# Patient Record
Sex: Male | Born: 1945 | Race: White | Hispanic: No | Smoking: Current every day smoker
Health system: Southern US, Community
[De-identification: ages and names within clinical notes are randomized; demographics above are authoritative.]

## PROBLEM LIST (undated history)

## (undated) DIAGNOSIS — I10 Essential (primary) hypertension: Secondary | ICD-10-CM

## (undated) DIAGNOSIS — F419 Anxiety disorder, unspecified: Secondary | ICD-10-CM

## (undated) DIAGNOSIS — M1990 Unspecified osteoarthritis, unspecified site: Secondary | ICD-10-CM

## (undated) DIAGNOSIS — J449 Chronic obstructive pulmonary disease, unspecified: Secondary | ICD-10-CM

## (undated) HISTORY — PX: BLADDER SURGERY: SHX569

## (undated) HISTORY — DX: Chronic obstructive pulmonary disease, unspecified: J44.9

---

## 1993-04-19 HISTORY — PX: HERNIA REPAIR: SHX51

## 2003-07-13 ENCOUNTER — Emergency Department (HOSPITAL_COMMUNITY): Admission: EM | Admit: 2003-07-13 | Discharge: 2003-07-13 | Payer: Self-pay | Admitting: Emergency Medicine

## 2009-03-04 ENCOUNTER — Ambulatory Visit (HOSPITAL_COMMUNITY): Admission: RE | Admit: 2009-03-04 | Discharge: 2009-03-04 | Payer: Self-pay | Admitting: Urology

## 2009-04-14 ENCOUNTER — Ambulatory Visit (HOSPITAL_COMMUNITY): Admission: RE | Admit: 2009-04-14 | Discharge: 2009-04-14 | Payer: Self-pay | Admitting: Urology

## 2010-05-12 LAB — BASIC METABOLIC PANEL
BUN: 9 mg/dL (ref 6–23)
CO2: 29 mEq/L (ref 19–32)
Calcium: 8.7 mg/dL (ref 8.4–10.5)
Chloride: 95 mEq/L — ABNORMAL LOW (ref 96–112)
Creatinine, Ser: 0.98 mg/dL (ref 0.4–1.5)
GFR calc Af Amer: >60 mL/min (ref >60)
GFR calc non Af Amer: >60 mL/min (ref >60)
Glucose, Bld: 75 mg/dL (ref 70–99)
Potassium: 3.7 mEq/L (ref 3.5–5.1)
Sodium: 131 mEq/L — ABNORMAL LOW (ref 135–145)

## 2010-05-12 LAB — SURGICAL PCR SCREEN
MRSA, PCR: NEGATIVE
Staphylococcus aureus: NEGATIVE

## 2010-05-12 LAB — HEMOGLOBIN AND HEMATOCRIT, BLOOD
HCT: 45.4 % (ref 39.0–52.0)
Hemoglobin: 16.1 g/dL (ref 13.0–17.0)

## 2010-05-14 ENCOUNTER — Ambulatory Visit (HOSPITAL_COMMUNITY)
Admission: RE | Admit: 2010-05-14 | Discharge: 2010-05-14 | Payer: Self-pay | Source: Home / Self Care | Attending: Urology | Admitting: Urology

## 2010-05-14 NOTE — H&P (Addendum)
  NAME:  Troy Sanders, Troy Sanders                ACCOUNT NO.:  [id number]  MEDICAL RECORD NO.:  [id number]          PATIENT TYPE:  AMB  LOCATION:  DAY                           FACILITY:  [facility]  PHYSICIAN:  Ky Barban, M.D.DATE OF BIRTH:  12-14-1945  DATE OF ADMISSION:  05/14/2010 DATE OF DISCHARGE:  LH                             HISTORY & PHYSICAL   CHIEF COMPLAINT:  Carcinoma in situ bladder.  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  Urine cytologies are negative.  He is being brought as outpatient to undergo multiple bladder biopsies and cystoscopy under anesthesia as outpatient. He understands.  PAST MEDICAL HISTORY:  Except carcinoma in situ of the bladder, otherwise he is fine.  He had penile condyloma removed in 2010, by Dr. Rito Ehrlich and no history of any other significant medical problem.  PAST HISTORY:  Negative.  FAMILY HISTORY:  Negative.  ALLERGIES:  None.  MEDICATIONS:  None.  REVIEW OF SYSTEMS:  Unremarkable.  PHYSICAL EXAMINATION:  VITAL SIGNS:  Blood pressure is 140/80, temperature is normal. CENTRAL NERVOUS SYSTEM:  Negative. HEAD, NECK, EYES, AND ENT:  Negative. CHEST:  Symmetrical. HEART:  Regular sinus rhythm.  No murmur. ABDOMEN:  Soft, flat.  Liver, spleen and kidneys are not palpable.  No CVA tenderness. EXTERNAL GENITALIA:  Unremarkable. RECTAL:  Deferred. EXTREMITIES:  Normal.  IMPRESSION:  History of carcinoma in situ of bladder, status post BCG treatment.  PLAN:  Cystoscopy, multiple bladder biopsies under anesthesia as outpatient.     Ky Barban, M.D.     MIJ/MEDQ  D:  05/13/2010  T:  05/14/2010  Job:  [id number]  Electronically Signed by Alleen Borne M.D. on 05/14/2010 04:55:52 PM

## 2010-05-27 NOTE — Op Note (Signed)
  NAME:  Troy Sanders, Troy Sanders                ACCOUNT NO.:  [id number]  MEDICAL RECORD NO.:  [id number]          PATIENT TYPE:  AMB  LOCATION:  DAY                           FACILITY:  [facility]  PHYSICIAN:  Ky Barban, M.D.DATE OF BIRTH:  Apr 26, 1945  DATE OF PROCEDURE:  05/14/2010 DATE OF DISCHARGE:  05/14/2010                              OPERATIVE REPORT   PREOPERATIVE DIAGNOSIS:  Bladder cancer.  POSTOPERATIVE DIAGNOSIS:  Bladder cancer.  PROCEDURES: 1. Cystoscopy. 2. Multiple bladder biopsies.  ANESTHESIA:  General spinal.  DESCRIPTION OF PROCEDURE:  The patient, under spinal anesthesia, in lithotomy position, after usual prep and drape, #25 cystoscope introduced into the bladder, it was thoroughly inspected.  There was no gross evidence of any tumor, stone, foreign body, inflammation.  Then, using a flexible biopsy forceps, multiple biopsies from different areas in the bladder were done, first posterior bladder wall, then right bladder wall, and left bladder wall at the end, anterior bladder wall. All the biopsy was done.  Then using a Greenwald electrode, the area of the biopsy site were simply fulgurated to stop the bleeding at the end. There is no bleeding.  Urine is clear and I already had looked at the ureteral orifices.  They looked normal with clear reflux.  Cystoscope was removed and I did a rectal exam.  Prostate feels normal.  It is firm, 1-1/2+.  All the instruments were removed.  The patient left the operating room in satisfactory condition.     Ky Barban, M.D.     MIJ/MEDQ  D:  05/14/2010  T:  05/15/2010  Job:  [id number]  Electronically Signed by Alleen Borne M.D. on 05/27/2010 11:00:01 AM

## 2010-07-20 LAB — BASIC METABOLIC PANEL
BUN: 11 mg/dL (ref 6–23)
CO2: 28 mEq/L (ref 19–32)
Calcium: 9.1 mg/dL (ref 8.4–10.5)
Chloride: 99 mEq/L (ref 96–112)
Creatinine, Ser: 0.99 mg/dL (ref 0.4–1.5)
GFR calc Af Amer: >60 mL/min (ref >60)
GFR calc non Af Amer: >60 mL/min (ref >60)
Glucose, Bld: 102 mg/dL — ABNORMAL HIGH (ref 70–99)
Potassium: 4 mEq/L (ref 3.5–5.1)
Sodium: 134 mEq/L — ABNORMAL LOW (ref 135–145)

## 2010-07-20 LAB — CBC
HCT: 47.5 % (ref 39.0–52.0)
Hemoglobin: 15.8 g/dL (ref 13.0–17.0)
MCHC: 33.3 g/dL (ref 30.0–36.0)
MCV: 92.5 fL (ref 78.0–100.0)
Platelets: 193 10*3/uL (ref 150–400)
RBC: 5.14 MIL/uL (ref 4.22–5.81)
RDW: 13.5 % (ref 11.5–15.5)
WBC: 5.6 10*3/uL (ref 4.0–10.5)

## 2010-07-20 LAB — DIFFERENTIAL
Basophils Absolute: 0 10*3/uL (ref 0.0–0.1)
Basophils Relative: 1 % (ref 0–1)
Eosinophils Absolute: 0.1 10*3/uL (ref 0.0–0.7)
Eosinophils Relative: 1 % (ref 0–5)
Lymphocytes Relative: 29 % (ref 12–46)
Lymphs Abs: 1.6 10*3/uL (ref 0.7–4.0)
Monocytes Absolute: 0.4 10*3/uL (ref 0.1–1.0)
Monocytes Relative: 7 % (ref 3–12)
Neutro Abs: 3.5 10*3/uL (ref 1.7–7.7)
Neutrophils Relative %: 62 % (ref 43–77)

## 2010-07-22 LAB — CREATININE, SERUM
Creatinine, Ser: 1.14 mg/dL (ref 0.4–1.5)
GFR calc Af Amer: >60 mL/min (ref >60)
GFR calc non Af Amer: >60 mL/min (ref >60)

## 2010-07-22 LAB — BUN: BUN: 9 mg/dL (ref 6–23)

## 2011-10-01 ENCOUNTER — Encounter: Payer: Self-pay | Admitting: Family Medicine

## 2011-10-01 ENCOUNTER — Ambulatory Visit (INDEPENDENT_AMBULATORY_CARE_PROVIDER_SITE_OTHER): Payer: Medicare Other | Admitting: Family Medicine

## 2011-10-01 VITALS — BP 138/72 | HR 55 | Resp 16 | Ht 74.0 in | Wt 191.0 lb

## 2011-10-01 DIAGNOSIS — R0602 Shortness of breath: Secondary | ICD-10-CM | POA: Diagnosis not present

## 2011-10-01 DIAGNOSIS — Z720 Tobacco use: Secondary | ICD-10-CM

## 2011-10-01 DIAGNOSIS — F17200 Nicotine dependence, unspecified, uncomplicated: Secondary | ICD-10-CM

## 2011-10-01 NOTE — Progress Notes (Signed)
  Subjective:    Patient ID: Troy Sanders, male    DOB: 1946-04-19, 66 y.o.   MRN: [id number]  HPI  Patient here to establish care. No medications. He has not had a primary doctor in 20 years. 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 He admits to shortness of breath worse at nighttime. He smokes 2 packs per day. Occasionally he feels himself wheezing. This is been going on for quite some time. He's never had any workup for this. He's not had any blood work in the past few years. He was seen by urology in the past after having incontinence. Cystoscopy was done which was concerning for bladder cancer however biopsies were negative.  Review of Systems  GEN- denies fatigue, fever, weight loss,weakness, recent illness HEENT- denies eye drainage, change in vision, nasal discharge, CVS- denies chest pain, palpitations RESP- + SOB, cough, wheeze ABD- denies N/V, change in stools, abd pain GU- denies dysuria, hematuria, dribbling, incontinence MSK- denies joint pain, muscle aches, injury Neuro- denies headache, dizziness, syncope, seizure activity      Objective:   Physical Exam GEN- NAD, alert and oriented x3 HEENT- PERRL, EOMI, non injected sclera, pink conjunctiva, MMM, oropharynx clear Neck- Supple, no thryomegaly CVS- RRR, no murmur RESP-occasional wheeze with forced expiration otherwise clear ABD-NABS,soft, NT,ND EXT- No edema Pulses- Radial, DP- 2+        Assessment & Plan:

## 2011-10-01 NOTE — Patient Instructions (Addendum)
Get the labs done within the next 2 weeks Get the breathing test done at [facility] F/U 4 weeks

## 2011-10-02 DIAGNOSIS — Z720 Tobacco use: Secondary | ICD-10-CM | POA: Insufficient documentation

## 2011-10-02 NOTE — Assessment & Plan Note (Signed)
Concern for COPD or chronic bronchitis, with cough and SOB worse at night. Will set up for PFT

## 2011-10-02 NOTE — Assessment & Plan Note (Signed)
Counseled on cessation 

## 2011-11-05 DIAGNOSIS — R0602 Shortness of breath: Secondary | ICD-10-CM | POA: Diagnosis not present

## 2011-11-06 LAB — COMPREHENSIVE METABOLIC PANEL
ALT: 11 U/L (ref 0–53)
AST: 11 U/L (ref 0–37)
Albumin: 4.3 g/dL (ref 3.5–5.2)
Alkaline Phosphatase: 76 U/L (ref 39–117)
BUN: 9 mg/dL (ref 6–23)
CO2: 33 mEq/L — ABNORMAL HIGH (ref 19–32)
Calcium: 9.1 mg/dL (ref 8.4–10.5)
Chloride: 103 mEq/L (ref 96–112)
Creat: 0.93 mg/dL (ref 0.50–1.35)
Glucose, Bld: 81 mg/dL (ref 70–99)
Potassium: 3.9 mEq/L (ref 3.5–5.3)
Sodium: 139 mEq/L (ref 135–145)
Total Bilirubin: 0.7 mg/dL (ref 0.3–1.2)
Total Protein: 6.5 g/dL (ref 6.0–8.3)

## 2011-11-06 LAB — CBC
HCT: 44.6 % (ref 39.0–52.0)
Hemoglobin: 15.4 g/dL (ref 13.0–17.0)
MCH: 30.1 pg (ref 26.0–34.0)
MCHC: 34.5 g/dL (ref 30.0–36.0)
MCV: 87.1 fL (ref 78.0–100.0)
Platelets: 185 10*3/uL (ref 150–400)
RBC: 5.12 MIL/uL (ref 4.22–5.81)
RDW: 13.8 % (ref 11.5–15.5)
WBC: 7.5 10*3/uL (ref 4.0–10.5)

## 2011-12-14 ENCOUNTER — Telehealth: Payer: Self-pay | Admitting: Family Medicine

## 2011-12-14 ENCOUNTER — Other Ambulatory Visit: Payer: Self-pay

## 2011-12-14 DIAGNOSIS — R0602 Shortness of breath: Secondary | ICD-10-CM

## 2011-12-23 ENCOUNTER — Ambulatory Visit (HOSPITAL_COMMUNITY): Payer: Medicare Other

## 2012-01-10 ENCOUNTER — Ambulatory Visit (HOSPITAL_COMMUNITY): Payer: Medicare Other | Attending: Family Medicine

## 2012-01-21 ENCOUNTER — Ambulatory Visit (HOSPITAL_COMMUNITY)
Admission: RE | Admit: 2012-01-21 | Discharge: 2012-01-21 | Disposition: A | Discharge status: Home / Self Care | Payer: Medicare Other | Source: Ambulatory Visit | Attending: Family Medicine | Admitting: Family Medicine

## 2012-01-21 DIAGNOSIS — R0602 Shortness of breath: Secondary | ICD-10-CM | POA: Diagnosis not present

## 2012-01-21 DIAGNOSIS — J449 Chronic obstructive pulmonary disease, unspecified: Secondary | ICD-10-CM | POA: Diagnosis not present

## 2012-01-21 DIAGNOSIS — J4489 Other specified chronic obstructive pulmonary disease: Secondary | ICD-10-CM | POA: Insufficient documentation

## 2012-01-21 MED ORDER — ALBUTEROL SULFATE (5 MG/ML) 0.5% IN NEBU
2.5000 mg | INHALATION_SOLUTION | Freq: Once | RESPIRATORY_TRACT | Status: AC
  Administered 2012-01-21: 2.5 mg via RESPIRATORY_TRACT

## 2012-01-24 NOTE — Procedures (Signed)
NAME:  Troy Sanders, Troy Sanders                ACCOUNT NO.:  [id number]  MEDICAL RECORD NO.:  [id number]  LOCATION:  RESP                          FACILITY:  [facility]  PHYSICIAN:  Sheza Strickland L. Juanetta Gosling, M.D.DATE OF BIRTH:  [birth year]  DATE OF PROCEDURE: DATE OF DISCHARGE:  01/21/2012                           PULMONARY FUNCTION TEST   Reason for pulmonary function testing is shortness of breath. 1. Spirometry shows a moderate to severe ventilatory defect with     evidence of airflow obstruction. 2. Lung volumes show marked air trapping. 3. DLCO is normal. 4. Airway resistance is elevated suggesting and confirming airflow     obstruction. 5. There is significant bronchodilator improvement. 6. This study is consistent with COPD.     Lilyanna Lunt L. Juanetta Gosling, M.D.     ELH/MEDQ  D:  01/24/2012  T:  01/24/2012  Job:  [id number]  cc:   Milinda Antis, MD

## 2012-02-01 LAB — PULMONARY FUNCTION TEST

## 2012-12-05 ENCOUNTER — Ambulatory Visit: Payer: Medicare Other | Admitting: Family Medicine

## 2012-12-22 ENCOUNTER — Encounter: Payer: Self-pay | Admitting: Family Medicine

## 2012-12-22 ENCOUNTER — Ambulatory Visit (INDEPENDENT_AMBULATORY_CARE_PROVIDER_SITE_OTHER): Payer: Medicare Other | Admitting: Family Medicine

## 2012-12-22 VITALS — BP 104/60 | HR 52 | Temp 97.2°F | Resp 18 | Wt 198.0 lb

## 2012-12-22 DIAGNOSIS — J449 Chronic obstructive pulmonary disease, unspecified: Secondary | ICD-10-CM | POA: Diagnosis not present

## 2012-12-22 DIAGNOSIS — J4489 Other specified chronic obstructive pulmonary disease: Secondary | ICD-10-CM

## 2012-12-22 DIAGNOSIS — F17200 Nicotine dependence, unspecified, uncomplicated: Secondary | ICD-10-CM

## 2012-12-22 DIAGNOSIS — Z720 Tobacco use: Secondary | ICD-10-CM

## 2012-12-22 MED ORDER — ALBUTEROL SULFATE HFA 108 (90 BASE) MCG/ACT IN AERS: 2.0000 | INHALATION_SPRAY | RESPIRATORY_TRACT | Status: DC | PRN

## 2012-12-22 NOTE — Patient Instructions (Signed)
F/U 4 weeks Albuterol is the rescue inhaler Chronic Obstructive Pulmonary Disease Chronic obstructive pulmonary disease (COPD) is a lung disease. The lungs become damaged. This makes it hard to get air in and out of your lungs. The damage to your lungs cannot be changed. There are things you can do to improve the lungs and make you feel better. HOME CARE  Take all medicines as told by your doctor.  Use medicines that you breathe in (inhale) as told by your doctor.  Avoid medicines or cough syrups that dry up your airway (antihistamines) and do not allow you to get rid of thick spit.  If you smoke, stop.  Avoid being around smoke, chemicals, and fumes that bother your breathing.  Avoid people that have a catchy (contagious) sickness.  Avoid going outside when it is very hot, cold, or humid.  Use humidifiers in your home and near your bedside if it helps your breathing.  Drink enough fluids to keep your pee (urine) clear or pale yellow.  Eat healthy foods. Eat smaller meals more often and rest before meals.  Ask your doctor if it is okay to take vitamins or pills with minerals (supplements).  Exercise and stay active.  Rest with activity.  Get into a comfortable position when you have trouble breathing.  Learn and use tips on how to relax.  Learn and use tips on how to control your breathing as told by your doctor. Try:  Breathing in through your nose for 1 second. Then, breath out (exhale) through your puckered (like a whistle) lips for 2 seconds.  Putting one hand on your belly (abdomen). Breathe in slowly through your nose. Your hand on your belly should move out. Breathe out slowly through your puckered lips. Your hand on your belly should move in as you breathe out.  Learn and use controlled coughing to clear thick spit from your lungs. 1. Lean your head slightly forward. 2. Breathe in deeply. 3. Try to hold your breath for 3 seconds. 4. Keep your mouth slightly open  while coughing 2 times. 5. Spit any thick spit out into a tissue. 6. Rest and do the steps again 1 or 2 times as needed.  Get all shots (vaccines) a told by your doctor.  Learn how to manage stress.  Schedule and go to all follow-up doctor visits.  Go to therapy that can help you improve your lungs (pulmonary rehabilitation) as told by your doctor.  Use oxygen at home as told by your doctor. GET HELP RIGHT AWAY IF:   You can feel your heart beating really fast.  You have shortness of breath while resting.  You have shortness of breath that stops you from being able to talk.  You have shortness of breath that stops you from doing normal activities.  You have chest pain lasting longer than 5 minutes.  You start to shake uncontrollably (seizure).  Your family or friends notice that you are flustered or confused.  You cough up more thick spit than usual.  There is a change in the color or thickness of the spit.  Breathing is more difficult than usual.  Your breathing is faster than usual.  Your skin color is more blueish than usual.  You are running out of the medicine you take for breathing.  You are anxious, uneasy, fearful, or restless.  You have a fever. MAKE SURE YOU:   Understand these instructions.  Will watch your condition.  Will get help right away if  you are not doing well or get worse. Document Released: 09/22/2007 Document Revised: 03/22/2012 Document Reviewed: 06/05/2010 [facility] Patient Information 2014 [place], [place].

## 2012-12-23 ENCOUNTER — Encounter: Payer: Self-pay | Admitting: Family Medicine

## 2012-12-23 DIAGNOSIS — J449 Chronic obstructive pulmonary disease, unspecified: Secondary | ICD-10-CM | POA: Insufficient documentation

## 2012-12-23 NOTE — Assessment & Plan Note (Signed)
COPD, PFT showed improvement with bronchodilators Will give him a stry on Symbicort. We had samples in office Albuterol also given for rescue inhaler Discussed course of disease, shown how to use inhaler in office

## 2012-12-23 NOTE — Assessment & Plan Note (Signed)
Discussed importance of tobacco cessation. Unfortunately he is not ready to quit He declines flu and PNA vaccine today

## 2012-12-23 NOTE — Progress Notes (Signed)
  Subjective:    Patient ID: Troy Sanders, male    DOB: [birth year], 67 y.o.   MRN: [id number]  HPI  Pt here with continued SOB, cough with little exertion. He established care 1 year ago, had PFT done which showed Moderately Severe COPD but was lost to follow-up. States he would like to try something to help his breathing. Continues to smoke 1 ppd. Has cough with mild clear sputum and SOB with his activities daily. Denies Chest pain.  Declines immuizations  Review of Systems  GEN- denies fatigue, fever, weight loss,weakness, recent illness HEENT- denies eye drainage, change in vision, nasal discharge, CVS- denies chest pain, palpitations RESP- + SOB,+ cough, +wheeze ABD- denies N/V, change in stools, abd pain GU- denies dysuria, hematuria, dribbling, incontinence MSK- denies joint pain, muscle aches, injury Neuro- denies headache, dizziness, syncope, seizure activity      Objective:   Physical Exam GEN- NAD, alert and oriented x3 HEENT- PERRL, EOMI, non injected sclera, pink conjunctiva, MMM, oropharynx clear Neck- Supple, no LAD CVS- RRR, no murmur RESP- scattered bilat wheeze, normal WOB, no rhonchi, no rales, fair air movement EXT- No edema Pulses- Radial 2+        Assessment & Plan:

## 2013-01-22 ENCOUNTER — Encounter: Payer: Self-pay | Admitting: Family Medicine

## 2013-01-22 ENCOUNTER — Ambulatory Visit (INDEPENDENT_AMBULATORY_CARE_PROVIDER_SITE_OTHER): Payer: Medicare Other | Admitting: Family Medicine

## 2013-01-22 VITALS — BP 104/60 | HR 68 | Temp 97.0°F | Resp 16 | Wt 193.0 lb

## 2013-01-22 DIAGNOSIS — J449 Chronic obstructive pulmonary disease, unspecified: Secondary | ICD-10-CM | POA: Diagnosis not present

## 2013-01-22 MED ORDER — BUDESONIDE-FORMOTEROL FUMARATE 160-4.5 MCG/ACT IN AERO: 2.0000 | INHALATION_SPRAY | Freq: Two times a day (BID) | RESPIRATORY_TRACT | Status: DC

## 2013-01-22 MED ORDER — ALBUTEROL SULFATE HFA 108 (90 BASE) MCG/ACT IN AERS: 2.0000 | INHALATION_SPRAY | RESPIRATORY_TRACT | Status: DC | PRN

## 2013-01-22 NOTE — Patient Instructions (Addendum)
Continue symbicort  Use albuterol as needed You need to quit smoking!, this is for your health F/U 4 MONTHS

## 2013-01-22 NOTE — Assessment & Plan Note (Signed)
Symptoms improved with symbicort, continue current inhalers Discussed when to use albuterol

## 2013-01-22 NOTE — Progress Notes (Signed)
  Subjective:    Patient ID: Troy Sanders, male    DOB: 06-20-1945, 67 y.o.   MRN: [id number]  HPI  Pt here to f/u new inhalers symbicort. Statse able to breath when walking up stairs and out to car, does not give out of breath as easy. His GF has also noticed he sounds better. Continues to smoke  Review of Systems  GEN- denies fatigue, fever, weight loss,weakness, recent illness HEENT- denies eye drainage, change in vision, nasal discharge, CVS- denies chest pain, palpitations RESP- denies SOB, +cough, wheeze Neuro- denies headache, dizziness, syncope, seizure activity      Objective:   Physical Exam GEN- NAD, alert and oriented x3 HEENT- MMM, no oropharyngeal lesions CVS- RRR, no murmur RESP- scattered bilat wheeze, normal WOB, no rhonchi, no rales, good air movement EXT- No edema Pulses- Radial 2+       Assessment & Plan:

## 2013-05-25 ENCOUNTER — Encounter: Payer: Self-pay | Admitting: Family Medicine

## 2013-05-25 ENCOUNTER — Ambulatory Visit (INDEPENDENT_AMBULATORY_CARE_PROVIDER_SITE_OTHER): Payer: Medicare Other | Admitting: Family Medicine

## 2013-05-25 VITALS — BP 106/80 | HR 78 | Temp 98.0°F | Resp 18 | Ht 74.0 in | Wt 197.0 lb

## 2013-05-25 DIAGNOSIS — Z720 Tobacco use: Secondary | ICD-10-CM

## 2013-05-25 DIAGNOSIS — J449 Chronic obstructive pulmonary disease, unspecified: Secondary | ICD-10-CM | POA: Diagnosis not present

## 2013-05-25 DIAGNOSIS — F17200 Nicotine dependence, unspecified, uncomplicated: Secondary | ICD-10-CM | POA: Diagnosis not present

## 2013-05-25 MED ORDER — BUDESONIDE-FORMOTEROL FUMARATE 160-4.5 MCG/ACT IN AERO: 2.0000 | INHALATION_SPRAY | Freq: Two times a day (BID) | RESPIRATORY_TRACT | Status: DC

## 2013-05-25 NOTE — Patient Instructions (Signed)
F/U 6 months for physical- Morning appt- Do Not Eat Continue your inhalers

## 2013-05-25 NOTE — Progress Notes (Signed)
Patient ID: Troy Sanders, male   DOB: 1945-10-01, 68 y.o.   MRN: [id number]   Subjective:    Patient ID: Troy Sanders, male    DOB: Nov 16, 1945, 68 y.o.   MRN: [id number]  Patient presents for 4 mos follow up  Patient here to followup COPD he has no specific concerns today. He did have a respiratory illness a few weeks ago but he self treated at home. He states that his breathing is still doing well on the Symbicort and he rarely uses the albuterol. He has no intention of quitting cigarettes and declines any immunizations He does tell me that a good friend of his passed away in 04/11/23 and he has been suffering some grief from that but he does have support from her family with whom he lives  Review Of Systems:  GEN- denies fatigue, fever, weight loss,weakness, recent illness HEENT- denies eye drainage, change in vision, nasal discharge, CVS- denies chest pain, palpitations RESP- denies SOB, cough, +wheeze Neuro- denies headache, dizziness, syncope, seizure activity       Objective:    BP 106/80  Pulse 78  Temp(Src) 98 F (36.7 C) (Oral)  Resp 18  Ht 6\' 2"  (1.88 m)  Wt 197 lb (89.359 kg)  BMI 25.28 kg/m2 GEN- NAD, alert and oriented x3 HEENT- PERRL, EOMI, non injected sclera, pink conjunctiva, MMM, oropharynx clear CVS- RRR, no murmur RESP-Course BS,, no wheeze, normal WOB EXT- No edema Pulses- Radial 2+        Assessment & Plan:      Problem List Items Addressed This Visit   Tobacco user - Primary     Continues to use tobacco    COPD, moderate     Continue with symbicort  Twice a day  Albuterol as rescue inhaler     Relevant Medications      budesonide-formoterol (SYMBICORT) 160-4.5 MCG/ACT inhaler      Note: This dictation was prepared with Dragon dictation along with smaller phrase technology. Any transcriptional errors that result from this process are unintentional.

## 2013-05-25 NOTE — Assessment & Plan Note (Signed)
- 

## 2013-05-25 NOTE — Assessment & Plan Note (Signed)
Continue with symbicort  Twice a day  Albuterol as rescue inhaler

## 2013-07-04 ENCOUNTER — Encounter: Payer: Self-pay | Admitting: Family Medicine

## 2013-07-04 ENCOUNTER — Ambulatory Visit (INDEPENDENT_AMBULATORY_CARE_PROVIDER_SITE_OTHER): Payer: Medicare Other | Admitting: Family Medicine

## 2013-07-04 VITALS — BP 150/88 | HR 60 | Temp 98.3°F | Resp 14 | Ht 72.5 in | Wt 194.0 lb

## 2013-07-04 DIAGNOSIS — I10 Essential (primary) hypertension: Secondary | ICD-10-CM | POA: Diagnosis not present

## 2013-07-04 DIAGNOSIS — J449 Chronic obstructive pulmonary disease, unspecified: Secondary | ICD-10-CM

## 2013-07-04 MED ORDER — AMLODIPINE BESYLATE 5 MG PO TABS: 5.0000 mg | ORAL_TABLET | Freq: Every day | ORAL | Status: DC

## 2013-07-04 MED ORDER — ALBUTEROL SULFATE HFA 108 (90 BASE) MCG/ACT IN AERS: 2.0000 | INHALATION_SPRAY | RESPIRATORY_TRACT | Status: DC | PRN

## 2013-07-04 NOTE — Progress Notes (Signed)
Patient ID: ERVINE WITUCKI, male   DOB: 1945-09-18, 68 y.o.   MRN: [id number]     Subjective:    Patient ID: DIRCK BUTCH, male    DOB: 05-03-1945, 68 y.o.   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 He is no history of hypertension. He is a smoker. He was seen for his DOT exam in his blood pressure was measured multiple times and was severely elevated. He states that the top number was in the 150s. He was only given 3 months on his card and told to followup with his primary Dr. Today he is here with an elevated blood pressure as well. He does note he has had some mild headache which was relieved with Tylenol. He's not had any recent labs done as previously he had declined.   Review Of Systems:  GEN- denies fatigue, fever, weight loss,weakness, recent illness HEENT- denies eye drainage, change in vision, nasal discharge, CVS- denies chest pain, palpitations RESP- denies SOB, cough, wheeze ABD- denies N/V, change in stools, abd pain GU- denies dysuria, hematuria, dribbling, incontinence MSK- denies joint pain, muscle aches, injury Neuro- denies headache, dizziness, syncope, seizure activity       Objective:    BP 150/88  Pulse 60  Temp(Src) 98.3 F (36.8 C)  Resp 14  Ht 6' 0.5" (1.842 m)  Wt 194 lb (87.998 kg)  BMI 25.94 kg/m2 GEN- NAD, alert and oriented x3 HEENT- PERRL, EOMI, non injected sclera, pink conjunctiva, MMM, oropharynx clear Neck- Supple,  CVS- RRR, no murmur RESP-few scattered wheeze EXT- No edema Pulses- Radial 2+        Assessment & Plan:      Problem List Items Addressed This Visit   Essential hypertension, benign - Primary   Relevant Medications      amLODIpine (NORVASC) tablet   Other Relevant Orders      CBC with Differential      Comprehensive metabolic panel      TSH      Note: This dictation was prepared with Dragon dictation along with smaller phrase technology. Any  transcriptional errors that result from this process are unintentional.

## 2013-07-04 NOTE — Patient Instructions (Signed)
Start norvasc 5mg  once a day for blood pressure  We will call with lab results F/U 2 weeks for blood pressure

## 2013-07-04 NOTE — Assessment & Plan Note (Signed)
He did run out of his Symbicort I have provided him with samples until he is able to get his medication 2 weeks due to finances

## 2013-07-04 NOTE — Assessment & Plan Note (Signed)
Blood pressure is elevated even in the past he has had normal blood pressure readings. 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

## 2013-07-05 LAB — COMPREHENSIVE METABOLIC PANEL
ALT: 10 U/L (ref 0–53)
AST: 11 U/L (ref 0–37)
Albumin: 4 g/dL (ref 3.5–5.2)
Alkaline Phosphatase: 80 U/L (ref 39–117)
BUN: 6 mg/dL (ref 6–23)
CO2: 30 mEq/L (ref 19–32)
Calcium: 9.3 mg/dL (ref 8.4–10.5)
Chloride: 98 mEq/L (ref 96–112)
Creat: 0.78 mg/dL (ref 0.50–1.35)
Glucose, Bld: 96 mg/dL (ref 70–99)
Potassium: 4.5 mEq/L (ref 3.5–5.3)
Sodium: 134 mEq/L — ABNORMAL LOW (ref 135–145)
Total Bilirubin: 0.4 mg/dL (ref 0.2–1.2)
Total Protein: 6.5 g/dL (ref 6.0–8.3)

## 2013-07-05 LAB — CBC WITH DIFFERENTIAL/PLATELET
Basophils Absolute: 0.1 10*3/uL (ref 0.0–0.1)
Basophils Relative: 1 % (ref 0–1)
Eosinophils Absolute: 0.1 10*3/uL (ref 0.0–0.7)
Eosinophils Relative: 1 % (ref 0–5)
HCT: 43.6 % (ref 39.0–52.0)
Hemoglobin: 15.1 g/dL (ref 13.0–17.0)
Lymphocytes Relative: 27 % (ref 12–46)
Lymphs Abs: 1.7 10*3/uL (ref 0.7–4.0)
MCH: 29.8 pg (ref 26.0–34.0)
MCHC: 34.6 g/dL (ref 30.0–36.0)
MCV: 86.2 fL (ref 78.0–100.0)
Monocytes Absolute: 0.5 10*3/uL (ref 0.1–1.0)
Monocytes Relative: 8 % (ref 3–12)
Neutro Abs: 4 10*3/uL (ref 1.7–7.7)
Neutrophils Relative %: 63 % (ref 43–77)
Platelets: 208 10*3/uL (ref 150–400)
RBC: 5.06 MIL/uL (ref 4.22–5.81)
RDW: 14.4 % (ref 11.5–15.5)
WBC: 6.4 10*3/uL (ref 4.0–10.5)

## 2013-07-05 LAB — TSH: TSH: 1.878 u[IU]/mL (ref 0.350–4.500)

## 2013-07-18 ENCOUNTER — Encounter: Payer: Self-pay | Admitting: Family Medicine

## 2013-07-18 ENCOUNTER — Other Ambulatory Visit: Payer: Self-pay | Admitting: *Deleted

## 2013-07-18 ENCOUNTER — Ambulatory Visit (INDEPENDENT_AMBULATORY_CARE_PROVIDER_SITE_OTHER): Payer: Medicare Other | Admitting: Family Medicine

## 2013-07-18 VITALS — BP 132/78 | HR 64 | Temp 98.1°F | Resp 18 | Ht 72.0 in | Wt 194.0 lb

## 2013-07-18 DIAGNOSIS — I10 Essential (primary) hypertension: Secondary | ICD-10-CM | POA: Diagnosis not present

## 2013-07-18 MED ORDER — AMLODIPINE BESYLATE 5 MG PO TABS: 5.0000 mg | ORAL_TABLET | Freq: Every day | ORAL | Status: DC

## 2013-07-18 MED ORDER — ALBUTEROL SULFATE HFA 108 (90 BASE) MCG/ACT IN AERS: 2.0000 | INHALATION_SPRAY | Freq: Four times a day (QID) | RESPIRATORY_TRACT | Status: DC | PRN

## 2013-07-18 MED ORDER — BUDESONIDE-FORMOTEROL FUMARATE 160-4.5 MCG/ACT IN AERO: 2.0000 | INHALATION_SPRAY | Freq: Two times a day (BID) | RESPIRATORY_TRACT | Status: DC

## 2013-07-18 NOTE — Progress Notes (Signed)
Patient ID: CHRISHAUN SASSO, male   DOB: 1946-02-25, 68 y.o.   MRN: [id number]   Subjective:    Patient ID: IREOLUWA GRANT, male    DOB: 06/27/1945, 68 y.o.   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 He was started on Norvasc 5 mg he's done well with this medication. He has noted some indentation at his socks but no pain or swelling that he can actually see. No other side effects from the medication    Review Of Systems:  GEN- denies fatigue, fever, weight loss,weakness, recent illness HEENT- denies eye drainage, change in vision, nasal discharge, CVS- denies chest pain, palpitations RESP- denies SOB, cough, wheeze Neuro- denies headache, dizziness, syncope, seizure activity       Objective:    BP 132/78  Pulse 64  Temp(Src) 98.1 F (36.7 C) (Oral)  Resp 18  Ht 6' (1.829 m)  Wt 194 lb (87.998 kg)  BMI 26.31 kg/m2 GEN- NAD, alert and oriented x3 CVS- RRR, no murmur RESP-CTAB Ext- No edema, indentation at socks Pulses- Radial 2+        Assessment & Plan:      Problem List Items Addressed This Visit   None      Note: This dictation was prepared with Dragon dictation along with smaller phrase technology. Any transcriptional errors that result from this process are unintentional.

## 2013-07-18 NOTE — Patient Instructions (Addendum)
Continue blood pressure medication Continue inhalers F/U As previous

## 2013-07-18 NOTE — Assessment & Plan Note (Signed)
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

## 2013-07-18 NOTE — Telephone Encounter (Signed)
New orders obtained to continue with ProAir Inhaler.

## 2013-07-18 NOTE — Telephone Encounter (Signed)
Received letter stating that Proventil Inhaler is no longer covered by insurance.   MD made aware and new orders obtained to change to ProAir. Inhaler is not covered by insurance either.   Insurance preference is Symbicort.   MD made aware.

## 2013-10-22 ENCOUNTER — Ambulatory Visit: Payer: Medicare Other | Admitting: Family Medicine

## 2013-12-17 ENCOUNTER — Telehealth: Payer: Self-pay | Admitting: Family Medicine

## 2013-12-17 MED ORDER — AMLODIPINE BESYLATE 5 MG PO TABS: 5.0000 mg | ORAL_TABLET | Freq: Every day | ORAL | Status: DC

## 2013-12-17 NOTE — Telephone Encounter (Signed)
Prescription sent to pharmacy.

## 2013-12-17 NOTE — Telephone Encounter (Signed)
[facility]  patient needs refill on his blood pressure medication, he is making payment but has been discharged from practice  [phone]

## 2014-01-22 DIAGNOSIS — J449 Chronic obstructive pulmonary disease, unspecified: Secondary | ICD-10-CM | POA: Diagnosis not present

## 2014-01-22 DIAGNOSIS — I10 Essential (primary) hypertension: Secondary | ICD-10-CM | POA: Diagnosis not present

## 2014-01-25 DIAGNOSIS — I10 Essential (primary) hypertension: Secondary | ICD-10-CM | POA: Diagnosis not present

## 2014-01-29 DIAGNOSIS — F419 Anxiety disorder, unspecified: Secondary | ICD-10-CM | POA: Diagnosis not present

## 2014-02-26 DIAGNOSIS — F5221 Male erectile disorder: Secondary | ICD-10-CM | POA: Diagnosis not present

## 2014-02-26 DIAGNOSIS — F419 Anxiety disorder, unspecified: Secondary | ICD-10-CM | POA: Diagnosis not present

## 2014-02-26 DIAGNOSIS — I10 Essential (primary) hypertension: Secondary | ICD-10-CM | POA: Diagnosis not present

## 2014-06-12 DIAGNOSIS — I10 Essential (primary) hypertension: Secondary | ICD-10-CM | POA: Diagnosis not present

## 2014-06-14 DIAGNOSIS — J449 Chronic obstructive pulmonary disease, unspecified: Secondary | ICD-10-CM | POA: Diagnosis not present

## 2014-06-14 DIAGNOSIS — I10 Essential (primary) hypertension: Secondary | ICD-10-CM | POA: Diagnosis not present

## 2014-06-14 DIAGNOSIS — E291 Testicular hypofunction: Secondary | ICD-10-CM | POA: Diagnosis not present

## 2014-06-14 DIAGNOSIS — F419 Anxiety disorder, unspecified: Secondary | ICD-10-CM | POA: Diagnosis not present

## 2014-07-11 ENCOUNTER — Emergency Department (HOSPITAL_COMMUNITY)
Admission: EM | Admit: 2014-07-11 | Discharge: 2014-07-11 | Disposition: A | Discharge status: Home / Self Care | Payer: Medicare Other | Attending: Emergency Medicine | Admitting: Emergency Medicine

## 2014-07-11 ENCOUNTER — Encounter (HOSPITAL_COMMUNITY): Payer: Self-pay | Admitting: Emergency Medicine

## 2014-07-11 DIAGNOSIS — X58XXXA Exposure to other specified factors, initial encounter: Secondary | ICD-10-CM | POA: Diagnosis not present

## 2014-07-11 DIAGNOSIS — Y9289 Other specified places as the place of occurrence of the external cause: Secondary | ICD-10-CM | POA: Diagnosis not present

## 2014-07-11 DIAGNOSIS — Y998 Other external cause status: Secondary | ICD-10-CM | POA: Insufficient documentation

## 2014-07-11 DIAGNOSIS — S39012A Strain of muscle, fascia and tendon of lower back, initial encounter: Secondary | ICD-10-CM | POA: Diagnosis not present

## 2014-07-11 DIAGNOSIS — J449 Chronic obstructive pulmonary disease, unspecified: Secondary | ICD-10-CM | POA: Diagnosis not present

## 2014-07-11 DIAGNOSIS — Y9389 Activity, other specified: Secondary | ICD-10-CM | POA: Insufficient documentation

## 2014-07-11 DIAGNOSIS — Z79899 Other long term (current) drug therapy: Secondary | ICD-10-CM | POA: Insufficient documentation

## 2014-07-11 DIAGNOSIS — Z7951 Long term (current) use of inhaled steroids: Secondary | ICD-10-CM | POA: Diagnosis not present

## 2014-07-11 DIAGNOSIS — I10 Essential (primary) hypertension: Secondary | ICD-10-CM | POA: Diagnosis not present

## 2014-07-11 DIAGNOSIS — S3992XA Unspecified injury of lower back, initial encounter: Secondary | ICD-10-CM | POA: Diagnosis present

## 2014-07-11 DIAGNOSIS — Z720 Tobacco use: Secondary | ICD-10-CM | POA: Insufficient documentation

## 2014-07-11 HISTORY — DX: Unspecified osteoarthritis, unspecified site: M19.90

## 2014-07-11 HISTORY — DX: Essential (primary) hypertension: I10

## 2014-07-11 MED ORDER — HYDROCODONE-ACETAMINOPHEN 5-325 MG PO TABS
ORAL_TABLET | ORAL | Status: DC
Start: 2014-07-11 — End: 2016-11-16

## 2014-07-11 MED ORDER — METHOCARBAMOL 500 MG PO TABS: 500.0000 mg | ORAL_TABLET | Freq: Three times a day (TID) | ORAL | Status: DC

## 2014-07-11 NOTE — Discharge Instructions (Signed)

## 2014-07-11 NOTE — ED Notes (Signed)
PT c/o chronic back pain that increased this am when getting out of bed. PT denies any recent injury or urinary symptoms.

## 2014-07-14 NOTE — ED Provider Notes (Signed)
CSN: [id number]     Arrival date & time 07/11/14  1628 History   First MD Initiated Contact with Patient 07/11/14 1748     Chief Complaint  Patient presents with  . Back Pain     (Consider location/radiation/quality/duration/timing/severity/associated sxs/prior Treatment) HPI   Troy Sanders is a 69 y.o. male who presents to the Emergency Department complaining of low back pain for several hours.  He states the pain has been progressing since he bent over to pick up a large bag of dog food.  He denies pain at that time, but describes an aching pain across his lower back since.  Pain is worse with certain movements, and improves with rest.  He denies urine or bowel changes, numbness, pain or weakness of the lower extremities, abd pain or fever.     Past Medical History  Diagnosis Date  . COPD (chronic obstructive pulmonary disease)   . DJD (degenerative joint disease)   . Hypertension    Past Surgical History  Procedure Laterality Date  . Hernia repair  1995  . Bladder surgery     Family History  Problem Relation Age of Onset  . Heart disease Father    History  Substance Use Topics  . Smoking status: Current Every Day Smoker -- 1.00 packs/day    Types: Cigarettes  . Smokeless tobacco: Never Used     Comment: 1- 1.5 packs per day  . Alcohol Use: No    Review of Systems  Constitutional: Negative for fever, activity change and appetite change.  Respiratory: Negative for shortness of breath.   Cardiovascular: Negative for chest pain.  Gastrointestinal: Negative for vomiting, abdominal pain and constipation.  Genitourinary: Negative for dysuria, hematuria, flank pain, decreased urine volume and difficulty urinating.  Musculoskeletal: Positive for back pain. Negative for joint swelling.  Skin: Negative for rash.  Neurological: Negative for dizziness, weakness and numbness.  All other systems reviewed and are negative.     Allergies  Review of patient's allergies  indicates no known allergies.  Home Medications   Prior to Admission medications   Medication Sig Start Date End Date Taking? Authorizing Provider  albuterol (PROAIR HFA) 108 (90 BASE) MCG/ACT inhaler Inhale 2 puffs into the lungs every 6 (six) hours as needed for wheezing or shortness of breath. 07/18/13   Alycia Rossetti, MD  amLODipine (NORVASC) 5 MG tablet Take 1 tablet (5 mg total) by mouth daily. 12/17/13   Alycia Rossetti, MD  budesonide-formoterol [facility]) 160-4.5 MCG/ACT inhaler Inhale 2 puffs into the lungs 2 (two) times daily. 07/18/13   Alycia Rossetti, MD  HYDROcodone-acetaminophen (NORCO/VICODIN) 5-325 MG per tablet Take one tab po q 4-6 hrs prn pain 07/11/14   Tammi Wyett Narine, PA-C  methocarbamol (ROBAXIN) 500 MG tablet Take 1 tablet (500 mg total) by mouth 3 (three) times daily. 07/11/14   Tammi Cariah Salatino, PA-C   BP 135/75 mmHg  Pulse 65  Temp(Src) 98.2 F (36.8 C) (Oral)  Resp 20  Ht 6\' 2"  (1.88 m)  Wt 170 lb (77.111 kg)  BMI 21.82 kg/m2  SpO2 97% Physical Exam  Constitutional: He is oriented to person, place, and time. He appears well-developed and well-nourished. No distress.  HENT:  Head: Normocephalic and atraumatic.  Neck: Normal range of motion. Neck supple.  Cardiovascular: Normal rate, regular rhythm, normal heart sounds and intact distal pulses.   No murmur heard. Pulmonary/Chest: Effort normal and breath sounds normal. No respiratory distress.  Abdominal: Soft. He exhibits no distension. There  is no tenderness.  Musculoskeletal: He exhibits tenderness. He exhibits no edema.       Lumbar back: He exhibits tenderness and pain. He exhibits normal range of motion, no swelling, no deformity, no laceration and normal pulse.  ttp of the bilateral lumbar paraspinal muscles.  No spinal tenderness.  DP pulses are brisk and symmetrical.  Distal sensation intact.  Hip Flexors/Extensors are intact.  Pt has 5/5 strength against resistance of bilateral lower extremities.      Neurological: He is alert and oriented to person, place, and time. He has normal strength. No sensory deficit. He exhibits normal muscle tone. Coordination and gait normal.  Reflex Scores:      Patellar reflexes are 2+ on the right side and 2+ on the left side.      Achilles reflexes are 2+ on the right side and 2+ on the left side. Skin: Skin is warm and dry. No rash noted.  Nursing note and vitals reviewed.   ED Course  Procedures (including critical care time) Labs Review Labs Reviewed - No data to display  Imaging Review No results found.   EKG Interpretation None      MDM   Final diagnoses:  Lumbar strain, initial encounter    Pt is ambulatory, no focal neuro deficits, ambulates with steady gait.  No concerning sx's for emergent neurological or infectious process.  Likely lumbar strain.  Pt agrees to symptomatic tx and close PMD f/u if the sx's are not improving    Patrice Paradise, PA-C 07/14/14 1943  Nat Christen, MD 07/17/14 1243

## 2014-07-25 DIAGNOSIS — M62830 Muscle spasm of back: Secondary | ICD-10-CM | POA: Diagnosis not present

## 2014-07-25 DIAGNOSIS — Z6821 Body mass index (BMI) 21.0-21.9, adult: Secondary | ICD-10-CM | POA: Diagnosis not present

## 2014-07-25 DIAGNOSIS — M545 Low back pain: Secondary | ICD-10-CM | POA: Diagnosis not present

## 2014-08-26 ENCOUNTER — Other Ambulatory Visit: Payer: Self-pay | Admitting: Family Medicine

## 2014-08-26 NOTE — Telephone Encounter (Signed)
Refill refused.   Patient is no longer at [facility].

## 2014-08-27 ENCOUNTER — Other Ambulatory Visit: Payer: Self-pay | Admitting: Family Medicine

## 2014-08-27 NOTE — Telephone Encounter (Signed)
Patient has been discharged from practice.  Medication refill denied 

## 2014-08-28 ENCOUNTER — Other Ambulatory Visit: Payer: Self-pay | Admitting: Family Medicine

## 2014-08-29 ENCOUNTER — Other Ambulatory Visit: Payer: Self-pay | Admitting: Family Medicine

## 2014-08-29 NOTE — Telephone Encounter (Signed)
Refill denied 

## 2014-08-30 ENCOUNTER — Other Ambulatory Visit: Payer: Self-pay | Admitting: Family Medicine

## 2014-08-31 ENCOUNTER — Other Ambulatory Visit: Payer: Self-pay | Admitting: Family Medicine

## 2014-09-02 NOTE — Telephone Encounter (Signed)
Patient has been discharged from practice.  Medication refill denied 

## 2014-10-08 DIAGNOSIS — I10 Essential (primary) hypertension: Secondary | ICD-10-CM | POA: Diagnosis not present

## 2014-10-08 DIAGNOSIS — G4700 Insomnia, unspecified: Secondary | ICD-10-CM | POA: Diagnosis not present

## 2014-10-08 DIAGNOSIS — J449 Chronic obstructive pulmonary disease, unspecified: Secondary | ICD-10-CM | POA: Diagnosis not present

## 2014-10-08 DIAGNOSIS — K4090 Unilateral inguinal hernia, without obstruction or gangrene, not specified as recurrent: Secondary | ICD-10-CM | POA: Diagnosis not present

## 2014-10-30 ENCOUNTER — Other Ambulatory Visit: Payer: Self-pay | Admitting: Family Medicine

## 2014-10-30 NOTE — Telephone Encounter (Signed)
Refill denied.   Patient discharged from [facility]

## 2014-12-10 ENCOUNTER — Ambulatory Visit (INDEPENDENT_AMBULATORY_CARE_PROVIDER_SITE_OTHER): Payer: Medicare Other | Admitting: Urology

## 2014-12-10 DIAGNOSIS — N486 Induration penis plastica: Secondary | ICD-10-CM | POA: Diagnosis not present

## 2014-12-10 DIAGNOSIS — N529 Male erectile dysfunction, unspecified: Secondary | ICD-10-CM

## 2014-12-10 DIAGNOSIS — K4090 Unilateral inguinal hernia, without obstruction or gangrene, not specified as recurrent: Secondary | ICD-10-CM

## 2014-12-10 DIAGNOSIS — C659 Malignant neoplasm of unspecified renal pelvis: Secondary | ICD-10-CM | POA: Diagnosis not present

## 2014-12-10 DIAGNOSIS — C679 Malignant neoplasm of bladder, unspecified: Secondary | ICD-10-CM | POA: Diagnosis not present

## 2015-01-09 DIAGNOSIS — Z131 Encounter for screening for diabetes mellitus: Secondary | ICD-10-CM | POA: Diagnosis not present

## 2015-01-09 DIAGNOSIS — R7301 Impaired fasting glucose: Secondary | ICD-10-CM | POA: Diagnosis not present

## 2015-01-09 DIAGNOSIS — I10 Essential (primary) hypertension: Secondary | ICD-10-CM | POA: Diagnosis not present

## 2015-01-09 DIAGNOSIS — Z125 Encounter for screening for malignant neoplasm of prostate: Secondary | ICD-10-CM | POA: Diagnosis not present

## 2015-01-09 DIAGNOSIS — Z13220 Encounter for screening for lipoid disorders: Secondary | ICD-10-CM | POA: Diagnosis not present

## 2015-01-14 ENCOUNTER — Ambulatory Visit (INDEPENDENT_AMBULATORY_CARE_PROVIDER_SITE_OTHER): Payer: Self-pay | Admitting: Urology

## 2015-01-14 DIAGNOSIS — R7301 Impaired fasting glucose: Secondary | ICD-10-CM | POA: Diagnosis not present

## 2015-01-14 DIAGNOSIS — F5221 Male erectile disorder: Secondary | ICD-10-CM | POA: Diagnosis not present

## 2015-01-14 DIAGNOSIS — J449 Chronic obstructive pulmonary disease, unspecified: Secondary | ICD-10-CM | POA: Diagnosis not present

## 2015-01-14 DIAGNOSIS — C679 Malignant neoplasm of bladder, unspecified: Secondary | ICD-10-CM

## 2015-01-14 DIAGNOSIS — Z23 Encounter for immunization: Secondary | ICD-10-CM | POA: Diagnosis not present

## 2015-01-14 DIAGNOSIS — F411 Generalized anxiety disorder: Secondary | ICD-10-CM | POA: Diagnosis not present

## 2015-01-14 DIAGNOSIS — Z720 Tobacco use: Secondary | ICD-10-CM | POA: Diagnosis not present

## 2015-01-14 DIAGNOSIS — I10 Essential (primary) hypertension: Secondary | ICD-10-CM | POA: Diagnosis not present

## 2015-05-02 DIAGNOSIS — M545 Low back pain: Secondary | ICD-10-CM | POA: Diagnosis not present

## 2015-06-30 DIAGNOSIS — Z23 Encounter for immunization: Secondary | ICD-10-CM | POA: Diagnosis not present

## 2015-06-30 DIAGNOSIS — I10 Essential (primary) hypertension: Secondary | ICD-10-CM | POA: Diagnosis not present

## 2015-06-30 DIAGNOSIS — J449 Chronic obstructive pulmonary disease, unspecified: Secondary | ICD-10-CM | POA: Diagnosis not present

## 2015-06-30 DIAGNOSIS — F17200 Nicotine dependence, unspecified, uncomplicated: Secondary | ICD-10-CM | POA: Diagnosis not present

## 2015-06-30 DIAGNOSIS — F411 Generalized anxiety disorder: Secondary | ICD-10-CM | POA: Diagnosis not present

## 2015-07-07 ENCOUNTER — Ambulatory Visit (HOSPITAL_COMMUNITY)
Admission: RE | Admit: 2015-07-07 | Discharge: 2015-07-07 | Disposition: A | Discharge status: Home / Self Care | Payer: Medicare Other | Source: Ambulatory Visit | Attending: Internal Medicine | Admitting: Internal Medicine

## 2015-07-07 ENCOUNTER — Other Ambulatory Visit (HOSPITAL_COMMUNITY): Payer: Self-pay | Admitting: Internal Medicine

## 2015-07-07 DIAGNOSIS — J449 Chronic obstructive pulmonary disease, unspecified: Secondary | ICD-10-CM

## 2015-07-07 DIAGNOSIS — R918 Other nonspecific abnormal finding of lung field: Secondary | ICD-10-CM | POA: Diagnosis not present

## 2015-07-07 DIAGNOSIS — R05 Cough: Secondary | ICD-10-CM | POA: Diagnosis not present

## 2015-07-07 DIAGNOSIS — R509 Fever, unspecified: Secondary | ICD-10-CM | POA: Diagnosis not present

## 2015-07-07 DIAGNOSIS — J441 Chronic obstructive pulmonary disease with (acute) exacerbation: Secondary | ICD-10-CM | POA: Diagnosis not present

## 2015-07-08 ENCOUNTER — Other Ambulatory Visit (HOSPITAL_COMMUNITY): Payer: Self-pay | Admitting: Internal Medicine

## 2015-07-08 DIAGNOSIS — R9389 Abnormal findings on diagnostic imaging of other specified body structures: Secondary | ICD-10-CM

## 2015-07-09 DIAGNOSIS — J449 Chronic obstructive pulmonary disease, unspecified: Secondary | ICD-10-CM | POA: Diagnosis not present

## 2015-07-11 ENCOUNTER — Ambulatory Visit (HOSPITAL_COMMUNITY)
Admission: RE | Admit: 2015-07-11 | Discharge: 2015-07-11 | Disposition: A | Discharge status: Home / Self Care | Payer: Medicare Other | Source: Ambulatory Visit | Attending: Internal Medicine | Admitting: Internal Medicine

## 2015-07-11 DIAGNOSIS — R918 Other nonspecific abnormal finding of lung field: Secondary | ICD-10-CM | POA: Insufficient documentation

## 2015-07-11 DIAGNOSIS — R938 Abnormal findings on diagnostic imaging of other specified body structures: Secondary | ICD-10-CM | POA: Diagnosis present

## 2015-07-11 DIAGNOSIS — R9389 Abnormal findings on diagnostic imaging of other specified body structures: Secondary | ICD-10-CM

## 2015-08-09 DIAGNOSIS — J449 Chronic obstructive pulmonary disease, unspecified: Secondary | ICD-10-CM | POA: Diagnosis not present

## 2015-09-08 DIAGNOSIS — J449 Chronic obstructive pulmonary disease, unspecified: Secondary | ICD-10-CM | POA: Diagnosis not present

## 2015-10-09 DIAGNOSIS — J449 Chronic obstructive pulmonary disease, unspecified: Secondary | ICD-10-CM | POA: Diagnosis not present

## 2015-10-23 DIAGNOSIS — R739 Hyperglycemia, unspecified: Secondary | ICD-10-CM | POA: Diagnosis not present

## 2015-10-23 DIAGNOSIS — J984 Other disorders of lung: Secondary | ICD-10-CM | POA: Diagnosis not present

## 2015-10-23 DIAGNOSIS — I10 Essential (primary) hypertension: Secondary | ICD-10-CM | POA: Diagnosis not present

## 2015-10-23 DIAGNOSIS — J449 Chronic obstructive pulmonary disease, unspecified: Secondary | ICD-10-CM | POA: Diagnosis not present

## 2015-10-23 DIAGNOSIS — Z0000 Encounter for general adult medical examination without abnormal findings: Secondary | ICD-10-CM | POA: Diagnosis not present

## 2015-11-08 DIAGNOSIS — J449 Chronic obstructive pulmonary disease, unspecified: Secondary | ICD-10-CM | POA: Diagnosis not present

## 2015-11-18 DIAGNOSIS — E782 Mixed hyperlipidemia: Secondary | ICD-10-CM | POA: Diagnosis not present

## 2015-11-18 DIAGNOSIS — E291 Testicular hypofunction: Secondary | ICD-10-CM | POA: Diagnosis not present

## 2015-11-20 DIAGNOSIS — Z720 Tobacco use: Secondary | ICD-10-CM | POA: Diagnosis not present

## 2015-11-20 DIAGNOSIS — Z0000 Encounter for general adult medical examination without abnormal findings: Secondary | ICD-10-CM | POA: Diagnosis not present

## 2015-11-20 DIAGNOSIS — J449 Chronic obstructive pulmonary disease, unspecified: Secondary | ICD-10-CM | POA: Diagnosis not present

## 2015-11-20 DIAGNOSIS — L219 Seborrheic dermatitis, unspecified: Secondary | ICD-10-CM | POA: Diagnosis not present

## 2015-11-20 DIAGNOSIS — I10 Essential (primary) hypertension: Secondary | ICD-10-CM | POA: Diagnosis not present

## 2015-12-09 DIAGNOSIS — J449 Chronic obstructive pulmonary disease, unspecified: Secondary | ICD-10-CM | POA: Diagnosis not present

## 2016-01-09 DIAGNOSIS — J449 Chronic obstructive pulmonary disease, unspecified: Secondary | ICD-10-CM | POA: Diagnosis not present

## 2016-01-13 ENCOUNTER — Other Ambulatory Visit (HOSPITAL_COMMUNITY)
Admission: AD | Admit: 2016-01-13 | Discharge: 2016-01-13 | Disposition: A | Discharge status: Home / Self Care | Payer: Medicare Other | Source: Other Acute Inpatient Hospital | Attending: Urology | Admitting: Urology

## 2016-01-13 ENCOUNTER — Ambulatory Visit (INDEPENDENT_AMBULATORY_CARE_PROVIDER_SITE_OTHER): Payer: Medicare Other | Admitting: Urology

## 2016-01-13 DIAGNOSIS — Z23 Encounter for immunization: Secondary | ICD-10-CM | POA: Diagnosis not present

## 2016-01-13 DIAGNOSIS — A630 Anogenital (venereal) warts: Secondary | ICD-10-CM

## 2016-01-13 DIAGNOSIS — Z8551 Personal history of malignant neoplasm of bladder: Secondary | ICD-10-CM | POA: Diagnosis not present

## 2016-01-13 DIAGNOSIS — C678 Malignant neoplasm of overlapping sites of bladder: Secondary | ICD-10-CM | POA: Diagnosis not present

## 2016-02-08 DIAGNOSIS — J449 Chronic obstructive pulmonary disease, unspecified: Secondary | ICD-10-CM | POA: Diagnosis not present

## 2016-03-10 DIAGNOSIS — J449 Chronic obstructive pulmonary disease, unspecified: Secondary | ICD-10-CM | POA: Diagnosis not present

## 2016-04-09 DIAGNOSIS — J449 Chronic obstructive pulmonary disease, unspecified: Secondary | ICD-10-CM | POA: Diagnosis not present

## 2016-10-13 ENCOUNTER — Ambulatory Visit: Payer: Medicare Other | Admitting: Family Medicine

## 2016-10-14 ENCOUNTER — Encounter: Payer: Self-pay | Admitting: Family Medicine

## 2016-10-22 DIAGNOSIS — R7301 Impaired fasting glucose: Secondary | ICD-10-CM | POA: Diagnosis not present

## 2016-10-22 DIAGNOSIS — I10 Essential (primary) hypertension: Secondary | ICD-10-CM | POA: Diagnosis not present

## 2016-10-22 DIAGNOSIS — Z125 Encounter for screening for malignant neoplasm of prostate: Secondary | ICD-10-CM | POA: Diagnosis not present

## 2016-10-27 DIAGNOSIS — Z0000 Encounter for general adult medical examination without abnormal findings: Secondary | ICD-10-CM | POA: Diagnosis not present

## 2016-10-27 DIAGNOSIS — I10 Essential (primary) hypertension: Secondary | ICD-10-CM | POA: Diagnosis not present

## 2016-10-27 DIAGNOSIS — J449 Chronic obstructive pulmonary disease, unspecified: Secondary | ICD-10-CM | POA: Diagnosis not present

## 2016-10-27 DIAGNOSIS — R7303 Prediabetes: Secondary | ICD-10-CM | POA: Diagnosis not present

## 2016-10-27 DIAGNOSIS — K4090 Unilateral inguinal hernia, without obstruction or gangrene, not specified as recurrent: Secondary | ICD-10-CM | POA: Diagnosis not present

## 2016-11-16 ENCOUNTER — Ambulatory Visit (INDEPENDENT_AMBULATORY_CARE_PROVIDER_SITE_OTHER): Payer: Medicare Other | Admitting: General Surgery

## 2016-11-16 ENCOUNTER — Encounter: Payer: Self-pay | Admitting: General Surgery

## 2016-11-16 VITALS — BP 154/71 | HR 51 | Temp 98.9°F | Resp 18 | Ht 74.0 in | Wt 179.0 lb

## 2016-11-16 DIAGNOSIS — K4090 Unilateral inguinal hernia, without obstruction or gangrene, not specified as recurrent: Secondary | ICD-10-CM

## 2016-11-16 NOTE — H&P (Signed)
Troy Sanders; [id number]; 03-06-1946   HPI Patient is a 71 year old white male who was referred to my care by Dr. Nevada Crane for evaluation and treatment of a right inguinal hernia.  It was found on a routine examination recently.  He states that he thinks it is been present for a while, but is increasing in size and causing discomfort.  It mainly stays out when standing.  It is made worse with straining.  He currently has no pain.  No nausea or vomiting have been noted. Past Medical History:  Diagnosis Date  . COPD (chronic obstructive pulmonary disease) ([place])   . DJD (degenerative joint disease)   . Hypertension     Past Surgical History:  Procedure Laterality Date  . BLADDER SURGERY    . HERNIA REPAIR  1995    Family History  Problem Relation Age of Onset  . Heart disease Father     Current Outpatient Prescriptions on File Prior to Visit  Medication Sig Dispense Refill  . albuterol (PROAIR HFA) 108 (90 BASE) MCG/ACT inhaler Inhale 2 puffs into the lungs every 6 (six) hours as needed for wheezing or shortness of breath. 18 g 3  . amLODipine (NORVASC) 5 MG tablet Take 1 tablet (5 mg total) by mouth daily. 30 tablet 0  . budesonide-formoterol (SYMBICORT) 160-4.5 MCG/ACT inhaler Inhale 2 puffs into the lungs 2 (two) times daily. 1 Inhaler 6   No current facility-administered medications on file prior to visit.     No Known Allergies  History  Alcohol Use No    History  Smoking Status  . Current Every Day Smoker  . Packs/day: 1.00  . Types: Cigarettes  Smokeless Tobacco  . Never Used    Comment: 1- 1.5 packs per day    Review of Systems  Constitutional: Negative.   HENT: Positive for sinus pain.   Eyes: Negative.   Respiratory: Positive for cough, shortness of breath and wheezing.   Cardiovascular: Negative.   Gastrointestinal: Negative.   Genitourinary: Positive for frequency.  Musculoskeletal: Positive for joint pain and neck pain.  Skin: Negative.   Neurological:  Negative.   Endo/Heme/Allergies: Negative.   Psychiatric/Behavioral: Negative.     Objective   Vitals:   11/16/16 1432  BP: (!) 154/71  Pulse: (!) 51  Resp: 18  Temp: 98.9 F (37.2 C)    Physical Exam  Constitutional: He is oriented to person, place, and time and well-developed, well-nourished, and in no distress.  HENT:  Head: Normocephalic and atraumatic.  Neck: Normal range of motion. Neck supple.  Cardiovascular: Normal rate, regular rhythm and normal heart sounds.   No murmur heard. Pulmonary/Chest: Effort normal and breath sounds normal. He has no wheezes. He has no rales.  Abdominal: Soft. Bowel sounds are normal. He exhibits no distension. There is no tenderness.  Reducible right inguinal hernia noted.  Genitourinary:  Genitourinary Comments: Genitourinary examination within normal limits.  Neurological: He is alert and oriented to person, place, and time.  Skin: Skin is warm and dry.  Vitals reviewed.    Dr. Juel Burrow notes reviewed Assessment   right inguinal hernia Plan    patient is scheduled for a right inguinal herniorrhaphy with mesh on 11/19/2016.  The risks and benefits of the procedure including bleeding, infection, mesh use, and the possibility recurrence of the hernia were fully explained to the patient, who gave informed consent.

## 2016-11-16 NOTE — Progress Notes (Signed)
Troy Sanders; [id number]; 1945/10/06   HPI Patient is a 71 year old white male who was referred to my care by Dr. Nevada Crane for evaluation and treatment of a right inguinal hernia.  It was found on a routine examination recently.  He states that he thinks it is been present for a while, but is increasing in size and causing discomfort.  It mainly stays out when standing.  It is made worse with straining.  He currently has no pain.  No nausea or vomiting have been noted. Past Medical History:  Diagnosis Date  . COPD (chronic obstructive pulmonary disease) ([place])   . DJD (degenerative joint disease)   . Hypertension     Past Surgical History:  Procedure Laterality Date  . BLADDER SURGERY    . HERNIA REPAIR  1995    Family History  Problem Relation Age of Onset  . Heart disease Father     Current Outpatient Prescriptions on File Prior to Visit  Medication Sig Dispense Refill  . albuterol (PROAIR HFA) 108 (90 BASE) MCG/ACT inhaler Inhale 2 puffs into the lungs every 6 (six) hours as needed for wheezing or shortness of breath. 18 g 3  . amLODipine (NORVASC) 5 MG tablet Take 1 tablet (5 mg total) by mouth daily. 30 tablet 0  . budesonide-formoterol (SYMBICORT) 160-4.5 MCG/ACT inhaler Inhale 2 puffs into the lungs 2 (two) times daily. 1 Inhaler 6   No current facility-administered medications on file prior to visit.     No Known Allergies  History  Alcohol Use No    History  Smoking Status  . Current Every Day Smoker  . Packs/day: 1.00  . Types: Cigarettes  Smokeless Tobacco  . Never Used    Comment: 1- 1.5 packs per day    Review of Systems  Constitutional: Negative.   HENT: Positive for sinus pain.   Eyes: Negative.   Respiratory: Positive for cough, shortness of breath and wheezing.   Cardiovascular: Negative.   Gastrointestinal: Negative.   Genitourinary: Positive for frequency.  Musculoskeletal: Positive for joint pain and neck pain.  Skin: Negative.   Neurological:  Negative.   Endo/Heme/Allergies: Negative.   Psychiatric/Behavioral: Negative.     Objective   Vitals:   11/16/16 1432  BP: (!) 154/71  Pulse: (!) 51  Resp: 18  Temp: 98.9 F (37.2 C)    Physical Exam  Constitutional: He is oriented to person, place, and time and well-developed, well-nourished, and in no distress.  HENT:  Head: Normocephalic and atraumatic.  Neck: Normal range of motion. Neck supple.  Cardiovascular: Normal rate, regular rhythm and normal heart sounds.   No murmur heard. Pulmonary/Chest: Effort normal and breath sounds normal. He has no wheezes. He has no rales.  Abdominal: Soft. Bowel sounds are normal. He exhibits no distension. There is no tenderness.  Reducible right inguinal hernia noted.  Genitourinary:  Genitourinary Comments: Genitourinary examination within normal limits.  Neurological: He is alert and oriented to person, place, and time.  Skin: Skin is warm and dry.  Vitals reviewed.    Dr. Juel Burrow notes reviewed Assessment   right inguinal hernia Plan    patient is scheduled for a right inguinal herniorrhaphy with mesh on 11/19/2016.  The risks and benefits of the procedure including bleeding, infection, mesh use, and the possibility recurrence of the hernia were fully explained to the patient, who gave informed consent.

## 2016-11-16 NOTE — Patient Instructions (Signed)

## 2016-11-17 ENCOUNTER — Encounter (HOSPITAL_COMMUNITY): Payer: Self-pay

## 2016-11-17 ENCOUNTER — Other Ambulatory Visit: Payer: Self-pay

## 2016-11-17 ENCOUNTER — Encounter (HOSPITAL_COMMUNITY)
Admission: RE | Admit: 2016-11-17 | Discharge: 2016-11-17 | Disposition: A | Discharge status: Home / Self Care | Payer: Medicare Other | Source: Ambulatory Visit | Attending: General Surgery | Admitting: General Surgery

## 2016-11-17 DIAGNOSIS — J449 Chronic obstructive pulmonary disease, unspecified: Secondary | ICD-10-CM | POA: Diagnosis not present

## 2016-11-17 DIAGNOSIS — F419 Anxiety disorder, unspecified: Secondary | ICD-10-CM | POA: Diagnosis not present

## 2016-11-17 DIAGNOSIS — Z79899 Other long term (current) drug therapy: Secondary | ICD-10-CM | POA: Diagnosis not present

## 2016-11-17 DIAGNOSIS — F17210 Nicotine dependence, cigarettes, uncomplicated: Secondary | ICD-10-CM | POA: Diagnosis not present

## 2016-11-17 DIAGNOSIS — I10 Essential (primary) hypertension: Secondary | ICD-10-CM | POA: Diagnosis not present

## 2016-11-17 DIAGNOSIS — K4090 Unilateral inguinal hernia, without obstruction or gangrene, not specified as recurrent: Secondary | ICD-10-CM | POA: Diagnosis not present

## 2016-11-17 DIAGNOSIS — Z7951 Long term (current) use of inhaled steroids: Secondary | ICD-10-CM | POA: Diagnosis not present

## 2016-11-17 HISTORY — DX: Anxiety disorder, unspecified: F41.9

## 2016-11-17 LAB — CBC WITH DIFFERENTIAL/PLATELET
Basophils Absolute: 0 10*3/uL (ref 0.0–0.1)
Basophils Relative: 0 %
Eosinophils Absolute: 0.1 10*3/uL (ref 0.0–0.7)
Eosinophils Relative: 2 %
HCT: 46.7 % (ref 39.0–52.0)
Hemoglobin: 15.9 g/dL (ref 13.0–17.0)
Lymphocytes Relative: 28 %
Lymphs Abs: 1.6 10*3/uL (ref 0.7–4.0)
MCH: 30.3 pg (ref 26.0–34.0)
MCHC: 34 g/dL (ref 30.0–36.0)
MCV: 89 fL (ref 78.0–100.0)
Monocytes Absolute: 0.4 10*3/uL (ref 0.1–1.0)
Monocytes Relative: 7 %
Neutro Abs: 3.5 10*3/uL (ref 1.7–7.7)
Neutrophils Relative %: 63 %
Platelets: 177 10*3/uL (ref 150–400)
RBC: 5.25 MIL/uL (ref 4.22–5.81)
RDW: 13.1 % (ref 11.5–15.5)
WBC: 5.6 10*3/uL (ref 4.0–10.5)

## 2016-11-17 LAB — BASIC METABOLIC PANEL
Anion gap: 9 (ref 5–15)
BUN: 7 mg/dL (ref 6–20)
CO2: 26 mmol/L (ref 22–32)
Calcium: 9.1 mg/dL (ref 8.9–10.3)
Chloride: 99 mmol/L — ABNORMAL LOW (ref 101–111)
Creatinine, Ser: 0.7 mg/dL (ref 0.61–1.24)
GFR calc Af Amer: >60 mL/min (ref >60)
GFR calc non Af Amer: >60 mL/min (ref >60)
Glucose, Bld: 111 mg/dL — ABNORMAL HIGH (ref 65–99)
Potassium: 4.1 mmol/L (ref 3.5–5.1)
Sodium: 134 mmol/L — ABNORMAL LOW (ref 135–145)

## 2016-11-17 NOTE — Patient Instructions (Signed)
Troy Sanders  11/17/2016     @PREFPERIOPPHARMACY @   Your procedure is scheduled on  11/19/2016  Report to [facility] at  900  A.M.  Call this number if you have problems the morning of surgery:  [phone]   Remember:  Do not eat food or drink liquids after midnight.  Take these medicines the morning of surgery with A SIP OF WATER  Xanax, norvasc. Use your inhalers before you come.   Do not wear jewelry, make-up or nail polish.  Do not wear lotions, powders, or perfumes, or deoderant.  Do not shave 48 hours prior to surgery.  Men may shave face and neck.  Do not bring valuables to the hospital.  [facility] is not responsible for any belongings or valuables.  Contacts, dentures or bridgework may not be worn into surgery.  Leave your suitcase in the car.  After surgery it may be brought to your room.  For patients admitted to the hospital, discharge time will be determined by your treatment team.  Patients discharged the day of surgery will not be allowed to drive home.   Name and phone number of your driver:   family Special instructions:  None  Please read over the following fact sheets that you were given. Anesthesia Post-op Instructions and Care and Recovery After Surgery       Open Hernia Repair, Adult Open hernia repair is a surgical procedure to fix a hernia. A hernia occurs when an internal organ or tissue pushes out through a weak spot in the abdominal wall muscles. Hernias commonly occur in the groin and around the navel. Most hernias tend to get worse over time. Often, surgery is done to prevent the hernia from becoming bigger, uncomfortable, or an emergency. Emergency surgery may be needed if abdominal contents get stuck in the opening (incarcerated hernia) or the blood supply gets cut off (strangulated hernia). In an open repair, an incision is made in the abdomen to perform the surgery. Tell a health care provider about:  Any allergies you  have.  All medicines you are taking, including vitamins, herbs, eye drops, creams, and over-the-counter medicines.  Any problems you or family members have had with anesthetic medicines.  Any blood or bone disorders you have.  Any surgeries you have had.  Any medical conditions you have, including any recent cold or flu symptoms.  Whether you are pregnant or may be pregnant. What are the risks? Generally, this is a safe procedure. However, problems may occur, including:  Long-lasting (chronic) pain.  Bleeding.  Infection.  Damage to the testicle. This can cause shrinking or swelling.  Damage to the bladder, blood vessels, intestine, or nerves near the hernia.  Trouble passing urine.  Allergic reactions to medicines.  Return of the hernia.  What happens before the procedure? Staying hydrated Follow instructions from your health care provider about hydration, which may include:  Up to 2 hours before the procedure - you may continue to drink clear liquids, such as water, clear fruit juice, black coffee, and plain tea.  Eating and drinking restrictions Follow instructions from your health care provider about eating and drinking, which may include:  8 hours before the procedure - stop eating heavy meals or foods such as meat, fried foods, or fatty foods.  6 hours before the procedure - stop eating light meals or foods, such as toast or cereal.  6 hours before the procedure - stop  drinking milk or drinks that contain milk.  2 hours before the procedure - stop drinking clear liquids.  Medicines  Ask your health care provider about: ? Changing or stopping your regular medicines. This is especially important if you are taking diabetes medicines or blood thinners. ? Taking medicines such as aspirin and ibuprofen. These medicines can thin your blood. Do not take these medicines before your procedure if your health care provider instructs you not to.  You may be given  antibiotic medicine to help prevent infection. General instructions  You may have blood tests or imaging studies.  Ask your health care provider how your surgical site will be marked or identified.  If you smoke, do not smoke for at least 2 weeks before your procedure or for as long as told by your health care provider.  Let your health care provider know if you develop a cold or any infection before your surgery.  Plan to have someone take you home from the hospital or clinic.  If you will be going home right after the procedure, plan to have someone with you for 24 hours. What happens during the procedure?  To reduce your risk of infection: ? Your health care team will wash or sanitize their hands. ? Your skin will be washed with soap. ? Hair may be removed from the surgical area.  An IV tube will be inserted into one of your veins.  You will be given one or more of the following: ? A medicine to help you relax (sedative). ? A medicine to numb the area (local anesthetic). ? A medicine to make you fall asleep (general anesthetic).  Your surgeon will make an incision over the hernia.  The tissues of the hernia will be moved back into place.  The edges of the hernia may be stitched together.  The opening in the abdominal muscles will be closed with stitches (sutures). Or, your surgeon will place a mesh patch made of manmade (synthetic) material over the opening.  The incision will be closed.  A bandage (dressing) may be placed over the incision. The procedure may vary among health care providers and hospitals. What happens after the procedure?  Your blood pressure, heart rate, breathing rate, and blood oxygen level will be monitored until the medicines you were given have worn off.  You may be given medicine for pain.  Do not drive for 24 hours if you received a sedative. This information is not intended to replace advice given to you by your health care provider. Make  sure you discuss any questions you have with your health care provider. Document Released: 09/29/2000 Document Revised: 10/24/2015 Document Reviewed: 09/17/2015 Elsevier Interactive Patient Education  2018 [place], Adult, Care After These instructions give you information about caring for yourself after your procedure. Your doctor may also give you more specific instructions. If you have problems or questions, contact your doctor. Follow these instructions at home: Surgical cut (incision) care   Follow instructions from your doctor about how to take care of your surgical cut area. Make sure you: ? Wash your hands with soap and water before you change your bandage (dressing). If you cannot use soap and water, use hand sanitizer. ? Change your bandage as told by your doctor. ? Leave stitches (sutures), skin glue, or skin tape (adhesive) strips in place. They may need to stay in place for 2 weeks or longer. If tape strips get loose and curl up, you may  trim the loose edges. Do not remove tape strips completely unless your doctor says it is okay.  Check your surgical cut every day for signs of infection. Check for: ? More redness, swelling, or pain. ? More fluid or blood. ? Warmth. ? Pus or a bad smell. Activity  Do not drive or use heavy machinery while taking prescription pain medicine. Do not drive until your doctor says it is okay.  Until your doctor says it is okay: ? Do not lift anything that is heavier than 10 lb (4.5 kg). ? Do not play contact sports.  Return to your normal activities as told by your doctor. Ask your doctor what activities are safe. General instructions  To prevent or treat having a hard time pooping (constipation) while you are taking prescription pain medicine, your doctor may recommend that you: ? Drink enough fluid to keep your pee (urine) clear or pale yellow. ? Take over-the-counter or prescription medicines. ? Eat foods that are  high in fiber, such as fresh fruits and vegetables, whole grains, and beans. ? Limit foods that are high in fat and processed sugars, such as fried and sweet foods.  Take over-the-counter and prescription medicines only as told by your doctor.  Do not take baths, swim, or use a hot tub until your doctor says it is okay.  Keep all follow-up visits as told by your doctor. This is important. Contact a doctor if:  You develop a rash.  You have more redness, swelling, or pain around your surgical cut.  You have more fluid or blood coming from your surgical cut.  Your surgical cut feels warm to the touch.  You have pus or a bad smell coming from your surgical cut.  You have a fever or chills.  You have blood in your poop (stool).  You have not pooped in 2-3 days.  Medicine does not help your pain. Get help right away if:  You have chest pain or you are short of breath.  You feel light-headed.  You feel weak and dizzy (feel faint).  You have very bad pain.  You throw up (vomit) and your pain is worse. This information is not intended to replace advice given to you by your health care provider. Make sure you discuss any questions you have with your health care provider. Document Released: 04/26/2014 Document Revised: 10/24/2015 Document Reviewed: 09/17/2015 Elsevier Interactive Patient Education  2017 [place] Anesthesia, Adult General anesthesia is the use of medicines to make a person "go to sleep" (be unconscious) for a medical procedure. General anesthesia is often recommended when a procedure:  Is long.  Requires you to be still or in an unusual position.  Is major and can cause you to lose blood.  Is impossible to do without general anesthesia.  The medicines used for general anesthesia are called general anesthetics. In addition to making you sleep, the medicines:  Prevent pain.  Control your blood pressure.  Relax your muscles.  Tell a  health care provider about:  Any allergies you have.  All medicines you are taking, including vitamins, herbs, eye drops, creams, and over-the-counter medicines.  Any problems you or family members have had with anesthetic medicines.  Types of anesthetics you have had in the past.  Any bleeding disorders you have.  Any surgeries you have had.  Any medical conditions you have.  Any history of heart or lung conditions, such as heart failure, sleep apnea, or chronic obstructive pulmonary disease (COPD).  Whether you are pregnant or may be pregnant.  Whether you use tobacco, alcohol, marijuana, or street drugs.  Any history of Armed forces logistics/support/administrative officer.  Any history of depression or anxiety. What are the risks? Generally, this is a safe procedure. However, problems may occur, including:  Allergic reaction to anesthetics.  Lung and heart problems.  Inhaling food or liquids from your stomach into your lungs (aspiration).  Injury to nerves.  Waking up during your procedure and being unable to move (rare).  Extreme agitation or a state of mental confusion (delirium) when you wake up from the anesthetic.  Air in the bloodstream, which can lead to stroke.  These problems are more likely to develop if you are having a major surgery or if you have an advanced medical condition. You can prevent some of these complications by answering all of your health care provider's questions thoroughly and by following all pre-procedure instructions. General anesthesia can cause side effects, including:  Nausea or vomiting  A sore throat from the breathing tube.  Feeling cold or shivery.  Feeling tired, washed out, or achy.  Sleepiness or drowsiness.  Confusion or agitation.  What happens before the procedure? Staying hydrated Follow instructions from your health care provider about hydration, which may include:  Up to 2 hours before the procedure - you may continue to drink clear liquids,  such as water, clear fruit juice, black coffee, and plain tea.  Eating and drinking restrictions Follow instructions from your health care provider about eating and drinking, which may include:  8 hours before the procedure - stop eating heavy meals or foods such as meat, fried foods, or fatty foods.  6 hours before the procedure - stop eating light meals or foods, such as toast or cereal.  6 hours before the procedure - stop drinking milk or drinks that contain milk.  2 hours before the procedure - stop drinking clear liquids.  Medicines  Ask your health care provider about: ? Changing or stopping your regular medicines. This is especially important if you are taking diabetes medicines or blood thinners. ? Taking medicines such as aspirin and ibuprofen. These medicines can thin your blood. Do not take these medicines before your procedure if your health care provider instructs you not to. ? Taking new dietary supplements or medicines. Do not take these during the week before your procedure unless your health care provider approves them.  If you are told to take a medicine or to continue taking a medicine on the day of the procedure, take the medicine with sips of water. General instructions   Ask if you will be going home the same day, the following day, or after a longer hospital stay. ? Plan to have someone take you home. ? Plan to have someone stay with you for the first 24 hours after you leave the hospital or clinic.  For 3-6 weeks before the procedure, try not to use any tobacco products, such as cigarettes, chewing tobacco, and e-cigarettes.  You may brush your teeth on the morning of the procedure, but make sure to spit out the toothpaste. What happens during the procedure?  You will be given anesthetics through a mask and through an IV tube in one of your veins.  You may receive medicine to help you relax (sedative).  As soon as you are asleep, a breathing tube may be  used to help you breathe.  An anesthesia specialist will stay with you throughout the procedure. He or she will help  keep you comfortable and safe by continuing to give you medicines and adjusting the amount of medicine that you get. He or she will also watch your blood pressure, pulse, and oxygen levels to make sure that the anesthetics do not cause any problems.  If a breathing tube was used to help you breathe, it will be removed before you wake up. The procedure may vary among health care providers and hospitals. What happens after the procedure?  You will wake up, often slowly, after the procedure is complete, usually in a recovery area.  Your blood pressure, heart rate, breathing rate, and blood oxygen level will be monitored until the medicines you were given have worn off.  You may be given medicine to help you calm down if you feel anxious or agitated.  If you will be going home the same day, your health care provider may check to make sure you can stand, drink, and urinate.  Your health care providers will treat your pain and side effects before you go home.  Do not drive for 24 hours if you received a sedative.  You may: ? Feel nauseous and vomit. ? Have a sore throat. ? Have mental slowness. ? Feel cold or shivery. ? Feel sleepy. ? Feel tired. ? Feel sore or achy, even in parts of your body where you did not have surgery. This information is not intended to replace advice given to you by your health care provider. Make sure you discuss any questions you have with your health care provider. Document Released: 07/13/2007 Document Revised: 09/16/2015 Document Reviewed: 03/20/2015 Elsevier Interactive Patient Education  2018 [place] Anesthesia, Adult, Care After These instructions provide you with information about caring for yourself after your procedure. Your health care provider may also give you more specific instructions. Your treatment has been planned  according to current medical practices, but problems sometimes occur. Call your health care provider if you have any problems or questions after your procedure. What can I expect after the procedure? After the procedure, it is common to have:  Vomiting.  A sore throat.  Mental slowness.  It is common to feel:  Nauseous.  Cold or shivery.  Sleepy.  Tired.  Sore or achy, even in parts of your body where you did not have surgery.  Follow these instructions at home: For at least 24 hours after the procedure:  Do not: ? Participate in activities where you could fall or become injured. ? Drive. ? Use heavy machinery. ? Drink alcohol. ? Take sleeping pills or medicines that cause drowsiness. ? Make important decisions or sign legal documents. ? Take care of children on your own.  Rest. Eating and drinking  If you vomit, drink water, juice, or soup when you can drink without vomiting.  Drink enough fluid to keep your urine clear or pale yellow.  Make sure you have little or no nausea before eating solid foods.  Follow the diet recommended by your health care provider. General instructions  Have a responsible adult stay with you until you are awake and alert.  Return to your normal activities as told by your health care provider. Ask your health care provider what activities are safe for you.  Take over-the-counter and prescription medicines only as told by your health care provider.  If you smoke, do not smoke without supervision.  Keep all follow-up visits as told by your health care provider. This is important. Contact a health care provider if:  You continue to  have nausea or vomiting at home, and medicines are not helpful.  You cannot drink fluids or start eating again.  You cannot urinate after 8-12 hours.  You develop a skin rash.  You have fever.  You have increasing redness at the site of your procedure. Get help right away if:  You have  difficulty breathing.  You have chest pain.  You have unexpected bleeding.  You feel that you are having a life-threatening or urgent problem. This information is not intended to replace advice given to you by your health care provider. Make sure you discuss any questions you have with your health care provider. Document Released: 07/12/2000 Document Revised: 09/08/2015 Document Reviewed: 03/20/2015 Elsevier Interactive Patient Education  [employer].

## 2016-11-19 ENCOUNTER — Ambulatory Visit (HOSPITAL_COMMUNITY): Payer: Medicare Other | Admitting: Anesthesiology

## 2016-11-19 ENCOUNTER — Encounter (HOSPITAL_COMMUNITY)
Admission: RE | Disposition: A | Discharge status: Home / Self Care | Payer: Self-pay | Source: Ambulatory Visit | Attending: General Surgery

## 2016-11-19 ENCOUNTER — Ambulatory Visit (HOSPITAL_COMMUNITY)
Admission: RE | Admit: 2016-11-19 | Discharge: 2016-11-19 | Disposition: A | Discharge status: Home / Self Care | Payer: Medicare Other | Source: Ambulatory Visit | Attending: General Surgery | Admitting: General Surgery

## 2016-11-19 ENCOUNTER — Encounter (HOSPITAL_COMMUNITY): Payer: Self-pay | Admitting: *Deleted

## 2016-11-19 DIAGNOSIS — I10 Essential (primary) hypertension: Secondary | ICD-10-CM | POA: Diagnosis not present

## 2016-11-19 DIAGNOSIS — Z79899 Other long term (current) drug therapy: Secondary | ICD-10-CM | POA: Diagnosis not present

## 2016-11-19 DIAGNOSIS — Z7951 Long term (current) use of inhaled steroids: Secondary | ICD-10-CM | POA: Diagnosis not present

## 2016-11-19 DIAGNOSIS — F17210 Nicotine dependence, cigarettes, uncomplicated: Secondary | ICD-10-CM | POA: Diagnosis not present

## 2016-11-19 DIAGNOSIS — K4090 Unilateral inguinal hernia, without obstruction or gangrene, not specified as recurrent: Secondary | ICD-10-CM

## 2016-11-19 DIAGNOSIS — J449 Chronic obstructive pulmonary disease, unspecified: Secondary | ICD-10-CM | POA: Diagnosis not present

## 2016-11-19 DIAGNOSIS — F419 Anxiety disorder, unspecified: Secondary | ICD-10-CM | POA: Insufficient documentation

## 2016-11-19 HISTORY — PX: INGUINAL HERNIA REPAIR: SHX194

## 2016-11-19 SURGERY — REPAIR, HERNIA, INGUINAL, ADULT
Anesthesia: General | Laterality: Right

## 2016-11-19 MED ORDER — BUPIVACAINE LIPOSOME 1.3 % IJ SUSP
INTRAMUSCULAR | Status: AC
  Filled 2016-11-19: qty 20

## 2016-11-19 MED ORDER — MIDAZOLAM HCL 2 MG/2ML IJ SOLN
1.0000 mg | INTRAMUSCULAR | Status: AC
  Administered 2016-11-19: 2 mg via INTRAVENOUS

## 2016-11-19 MED ORDER — PROPOFOL 10 MG/ML IV BOLUS
INTRAVENOUS | Status: DC | PRN
  Administered 2016-11-19: 10 mg via INTRAVENOUS
  Administered 2016-11-19: 20 mg via INTRAVENOUS
  Administered 2016-11-19: 10 mg via INTRAVENOUS
  Administered 2016-11-19: 130 mg via INTRAVENOUS
  Administered 2016-11-19: 10 mg via INTRAVENOUS

## 2016-11-19 MED ORDER — CHLORHEXIDINE GLUCONATE CLOTH 2 % EX PADS: 6.0000 | MEDICATED_PAD | Freq: Once | CUTANEOUS | Status: DC

## 2016-11-19 MED ORDER — MIDAZOLAM HCL 2 MG/2ML IJ SOLN
INTRAMUSCULAR | Status: AC
  Filled 2016-11-19: qty 2

## 2016-11-19 MED ORDER — IPRATROPIUM-ALBUTEROL 0.5-2.5 (3) MG/3ML IN SOLN
3.0000 mL | Freq: Once | RESPIRATORY_TRACT | Status: AC
  Administered 2016-11-19: 3 mL via RESPIRATORY_TRACT

## 2016-11-19 MED ORDER — CEFAZOLIN SODIUM-DEXTROSE 2-4 GM/100ML-% IV SOLN
2.0000 g | INTRAVENOUS | Status: AC
  Administered 2016-11-19: 2 g via INTRAVENOUS

## 2016-11-19 MED ORDER — FENTANYL CITRATE (PF) 100 MCG/2ML IJ SOLN
INTRAMUSCULAR | Status: DC | PRN
  Administered 2016-11-19: 50 ug via INTRAVENOUS
  Administered 2016-11-19: 25 ug via INTRAVENOUS

## 2016-11-19 MED ORDER — LACTATED RINGERS IV SOLN
INTRAVENOUS | Status: DC
  Administered 2016-11-19: 10:00:00 via INTRAVENOUS

## 2016-11-19 MED ORDER — FENTANYL CITRATE (PF) 100 MCG/2ML IJ SOLN
INTRAMUSCULAR | Status: AC
  Filled 2016-11-19: qty 2

## 2016-11-19 MED ORDER — IPRATROPIUM-ALBUTEROL 0.5-2.5 (3) MG/3ML IN SOLN
RESPIRATORY_TRACT | Status: AC
  Filled 2016-11-19: qty 3

## 2016-11-19 MED ORDER — CEFAZOLIN SODIUM-DEXTROSE 2-4 GM/100ML-% IV SOLN
INTRAVENOUS | Status: AC
  Filled 2016-11-19: qty 100

## 2016-11-19 MED ORDER — BUPIVACAINE LIPOSOME 1.3 % IJ SUSP
INTRAMUSCULAR | Status: DC | PRN
Start: 2016-11-19 — End: 2016-11-19
  Administered 2016-11-19: 20 mL

## 2016-11-19 MED ORDER — EPHEDRINE SULFATE 50 MG/ML IJ SOLN
INTRAMUSCULAR | Status: AC
Start: 2016-11-19 — End: 2016-11-19
  Filled 2016-11-19: qty 1

## 2016-11-19 MED ORDER — SODIUM CHLORIDE 0.9 % IJ SOLN
INTRAMUSCULAR | Status: AC
  Filled 2016-11-19: qty 10

## 2016-11-19 MED ORDER — FENTANYL CITRATE (PF) 100 MCG/2ML IJ SOLN: 25.0000 ug | INTRAMUSCULAR | Status: DC | PRN

## 2016-11-19 MED ORDER — HYDROCODONE-ACETAMINOPHEN 7.5-325 MG PO TABS: 1.0000 | ORAL_TABLET | Freq: Four times a day (QID) | ORAL | 0 refills | Status: DC | PRN

## 2016-11-19 MED ORDER — SODIUM CHLORIDE 0.9 % IR SOLN
Status: DC | PRN
  Administered 2016-11-19: 1

## 2016-11-19 MED ORDER — ONDANSETRON HCL 4 MG/2ML IJ SOLN
4.0000 mg | Freq: Once | INTRAMUSCULAR | Status: AC
  Administered 2016-11-19: 4 mg via INTRAVENOUS

## 2016-11-19 MED ORDER — KETOROLAC TROMETHAMINE 30 MG/ML IJ SOLN
30.0000 mg | Freq: Once | INTRAMUSCULAR | Status: AC
  Administered 2016-11-19: 30 mg via INTRAVENOUS
  Filled 2016-11-19: qty 1

## 2016-11-19 MED ORDER — EPHEDRINE SULFATE 50 MG/ML IJ SOLN
INTRAMUSCULAR | Status: DC | PRN
  Administered 2016-11-19: 15 mg via INTRAVENOUS
  Administered 2016-11-19: 10 mg via INTRAVENOUS

## 2016-11-19 MED ORDER — ONDANSETRON HCL 4 MG/2ML IJ SOLN
INTRAMUSCULAR | Status: AC
  Filled 2016-11-19: qty 2

## 2016-11-19 SURGICAL SUPPLY — 35 items
BAG HAMPER (MISCELLANEOUS) ×2 IMPLANT
CLOTH BEACON ORANGE TIMEOUT ST (SAFETY) ×2 IMPLANT
COVER LIGHT HANDLE STERIS (MISCELLANEOUS) ×4 IMPLANT
DERMABOND ADVANCED (GAUZE/BANDAGES/DRESSINGS) ×1
DERMABOND ADVANCED .7 DNX12 (GAUZE/BANDAGES/DRESSINGS) ×1 IMPLANT
DRAIN PENROSE 18X1/2 LTX STRL (DRAIN) ×2 IMPLANT
ELECT REM PT RETURN 9FT ADLT (ELECTROSURGICAL) ×2
ELECTRODE REM PT RTRN 9FT ADLT (ELECTROSURGICAL) ×1 IMPLANT
GLOVE BIOGEL PI IND STRL 7.0 (GLOVE) ×3 IMPLANT
GLOVE BIOGEL PI INDICATOR 7.0 (GLOVE) ×3
GLOVE ECLIPSE 6.5 STRL STRAW (GLOVE) ×2 IMPLANT
GLOVE SURG SS PI 7.5 STRL IVOR (GLOVE) ×2 IMPLANT
GOWN STRL REUS W/ TWL XL LVL3 (GOWN DISPOSABLE) ×1 IMPLANT
GOWN STRL REUS W/TWL LRG LVL3 (GOWN DISPOSABLE) ×4 IMPLANT
GOWN STRL REUS W/TWL XL LVL3 (GOWN DISPOSABLE) ×1
INST SET MINOR GENERAL (KITS) ×2 IMPLANT
KIT ROOM TURNOVER APOR (KITS) ×2 IMPLANT
MANIFOLD NEPTUNE II (INSTRUMENTS) ×2 IMPLANT
MESH MARLEX PLUG MEDIUM (Mesh General) ×4 IMPLANT
NEEDLE HYPO 21X1.5 SAFETY (NEEDLE) ×2 IMPLANT
NS IRRIG 1000ML POUR BTL (IV SOLUTION) ×2 IMPLANT
PACK MINOR (CUSTOM PROCEDURE TRAY) ×2 IMPLANT
PAD ARMBOARD 7.5X6 YLW CONV (MISCELLANEOUS) ×2 IMPLANT
SET BASIN LINEN APH (SET/KITS/TRAYS/PACK) ×2 IMPLANT
SUT NOVA NAB GS-22 2 2-0 T-19 (SUTURE) ×4 IMPLANT
SUT PROLENE 2 0 SH 30 (SUTURE) IMPLANT
SUT SILK 3 0 (SUTURE)
SUT SILK 3-0 18XBRD TIE 12 (SUTURE) IMPLANT
SUT VIC AB 2-0 CT1 27 (SUTURE) ×1
SUT VIC AB 2-0 CT1 TAPERPNT 27 (SUTURE) ×1 IMPLANT
SUT VIC AB 3-0 SH 27 (SUTURE) ×1
SUT VIC AB 3-0 SH 27X BRD (SUTURE) ×1 IMPLANT
SUT VIC AB 4-0 PS2 27 (SUTURE) ×2 IMPLANT
SUT VICRYL AB 3 0 TIES (SUTURE) ×2 IMPLANT
SYR 20CC LL (SYRINGE) ×2 IMPLANT

## 2016-11-19 NOTE — Anesthesia Postprocedure Evaluation (Signed)
Anesthesia Post Note  Patient: Troy Sanders  Procedure(s) Performed: Procedure(s) (LRB): RIGHT INGUINAL HERNIORRHAPHY WITH MESH (Right)  Patient location during evaluation: PACU Anesthesia Type: General Level of consciousness: awake and alert Pain management: satisfactory to patient Vital Signs Assessment: post-procedure vital signs reviewed and stable Respiratory status: spontaneous breathing Cardiovascular status: stable Postop Assessment: no signs of nausea or vomiting Anesthetic complications: no     Last Vitals:  Vitals:   11/19/16 1245 11/19/16 1300  BP: 118/69 109/75  Pulse: (!) 58 (!) 49  Resp: 15 20  Temp:      Last Pain: There were no vitals filed for this visit.               Drucie Opitz

## 2016-11-19 NOTE — Interval H&P Note (Signed)
History and Physical Interval Note:  11/19/2016 10:49 AM  Troy Sanders  has presented today for surgery, with the diagnosis of right inguinal hernia  The various methods of treatment have been discussed with the patient and family. After consideration of risks, benefits and other options for treatment, the patient has consented to  Procedure(s): HERNIA REPAIR INGUINAL ADULT WITH MESH (Right) as a surgical intervention .  The patient's history has been reviewed, patient examined, no change in status, stable for surgery.  I have reviewed the patient's chart and labs.  Questions were answered to the patient's satisfaction.     Aviva Signs

## 2016-11-19 NOTE — Progress Notes (Signed)
Patients O2 sat bouncing between 87-89% on room air. Discussed with Dr. Patsey Berthold. Ok to discharge home.

## 2016-11-19 NOTE — Anesthesia Procedure Notes (Signed)
Procedure Name: LMA Insertion Date/Time: 11/19/2016 11:24 AM Performed by: Vista Deck Pre-anesthesia Checklist: Patient identified, Patient being monitored, Emergency Drugs available, Timeout performed and Suction available Patient Re-evaluated:Patient Re-evaluated prior to induction Oxygen Delivery Method: Circle System Utilized Preoxygenation: Pre-oxygenation with 100% oxygen Induction Type: IV induction Ventilation: Mask ventilation without difficulty LMA: LMA inserted LMA Size: 4.0 Number of attempts: 1 Placement Confirmation: positive ETCO2 and breath sounds checked- equal and bilateral Tube secured with: Tape Dental Injury: Teeth and Oropharynx as per pre-operative assessment

## 2016-11-19 NOTE — Anesthesia Preprocedure Evaluation (Signed)
Anesthesia Evaluation  Patient identified by MRN, date of birth, ID band Patient awake    Reviewed: Allergy & Precautions, NPO status , Patient's Chart, lab work & pertinent test results  Airway Mallampati: I  TM Distance: >3 FB Neck ROM: Full    Dental  (+) Edentulous Upper, Edentulous Lower   Pulmonary COPD,  COPD inhaler, Current Smoker,   Clears with coughing  breath sounds clear to auscultation       Cardiovascular hypertension, Pt. on medications  Rhythm:Regular Rate:Normal     Neuro/Psych Anxiety    GI/Hepatic negative GI ROS, Neg liver ROS,   Endo/Other    Renal/GU      Musculoskeletal   Abdominal   Peds  Hematology negative hematology ROS (+)   Anesthesia Other Findings   Reproductive/Obstetrics                             Anesthesia Physical Anesthesia Plan  ASA: III  Anesthesia Plan: General   Post-op Pain Management:    Induction: Intravenous  PONV Risk Score and Plan:   Airway Management Planned: LMA  Additional Equipment:   Intra-op Plan:   Post-operative Plan: Extubation in OR  Informed Consent: I have reviewed the patients History and Physical, chart, labs and discussed the procedure including the risks, benefits and alternatives for the proposed anesthesia with the patient or authorized representative who has indicated his/her understanding and acceptance.     Plan Discussed with:   Anesthesia Plan Comments:         Anesthesia Quick Evaluation

## 2016-11-19 NOTE — Op Note (Signed)
Patient:  Troy Sanders  DOB:  [birth year]  MRN:  [id number]   Preop Diagnosis:  Right inguinal hernia  Postop Diagnosis:  Same  Procedure:  Right inguinal herniorrhaphy with mesh  Surgeon:  Aviva Signs, M.D.  Anes:  Gen.  Indications:  Patient is a 71 year old white male who presents with a right inguinal hernia. The risks and benefits of the procedure including bleeding, infection, mesh use, and the possibility of recurrence of the hernia were fully explained to the patient, who gave informed consent.  Procedure note:  The patient was placed in supine position. After general anesthesia was administered, the right groin region was prepped and draped using usual sterile technique with Techni-Care. Surgical site confirmation was performed.  A transverse incision was made in the right groin region down to the external oblique aponeuroses. The aponeuroses was incised to the external ring. A Penrose drain was placed around the spermatic cord. The vase deferens was noted within the spermatic cord. The ilioinguinal nerve was identified and somewhat scarred down. It was excised and tied off using 3-0 Vicryl ties. The patient had both an indirect and direct hernia. Both were freed away down to the transversalis fascia and inverted. 2 medium Bard PerFix plugs were then inserted. An onlay patch was then placed along the floor of the inguinal canal and secured superiorly to the conjoined tendon and inferiorly to the shelving edge of Poupart's ligament using 2-0 Novafil interrupted sutures. The internal ring was re-created using a 2-0 Novafil interrupted suture. The external oblique aponeuroses was reapproximated using a 2-0 Vicryl running suture. Subcutaneous layer was reapproximated using a 3-0 Vicryl interrupted suture. The skin was closed using a 4-0 Vicryl subcuticular suture.  Exparel was instilled into the surrounding wound. Dermabond was applied.  All tape and needle counts were correct at the end  of the procedure. The patient was awakened and transferred to PACU in stable condition.  Complications:  None  EBL:  Minimal  Specimen:  None

## 2016-11-19 NOTE — Transfer of Care (Signed)
Immediate Anesthesia Transfer of Care Note  Patient: Troy Sanders  Procedure(s) Performed: Procedure(s): RIGHT INGUINAL HERNIORRHAPHY WITH MESH (Right)  Patient Location: PACU  Anesthesia Type:General  Level of Consciousness: sedated and patient cooperative  Airway & Oxygen Therapy: Patient Spontanous Breathing and Patient connected to nasal cannula oxygen  Post-op Assessment: Report given to RN and Post -op Vital signs reviewed and stable  Post vital signs: Reviewed and stable  Last Vitals:  Vitals:   11/19/16 1105 11/19/16 1107  BP: 119/69   Resp: (!) 24 (!) 26  Temp:      Last Pain: There were no vitals filed for this visit.       Complications: No apparent anesthesia complications

## 2016-11-19 NOTE — Anesthesia Postprocedure Evaluation (Signed)
Anesthesia Post Note  Patient: TAURUS ALAMO  Procedure(s) Performed: Procedure(s) (LRB): RIGHT INGUINAL HERNIORRHAPHY WITH MESH (Right)  Patient location during evaluation: PACU Anesthesia Type: General Level of consciousness: awake and alert, oriented and patient cooperative Pain management: pain level controlled Vital Signs Assessment: post-procedure vital signs reviewed and stable Respiratory status: spontaneous breathing and patient connected to nasal cannula oxygen Cardiovascular status: stable Postop Assessment: no signs of nausea or vomiting Anesthetic complications: no     Last Vitals:  Vitals:   11/19/16 1107 11/19/16 1230  BP:  125/79  Pulse:  66  Resp: (!) 26 19  Temp:  36.4 C    Last Pain: There were no vitals filed for this visit.               Amiyah Shryock A

## 2016-11-19 NOTE — Discharge Instructions (Signed)
Open Hernia Repair, Adult, Care After °This sheet gives you information about how to care for yourself after your procedure. Your health care provider may also give you more specific instructions. If you have problems or questions, contact your health care provider. °What can I expect after the procedure? °After the procedure, it is common to have: °· Mild discomfort. °· Slight bruising. °· Minor swelling. °· Pain in the abdomen. ° °Follow these instructions at home: °Incision care ° °· Follow instructions from your health care provider about how to take care of your incision area. Make sure you: °? Wash your hands with soap and water before you change your bandage (dressing). If soap and water are not available, use hand sanitizer. °? Change your dressing as told by your health care provider. °? Leave stitches (sutures), skin glue, or adhesive strips in place. These skin closures may need to stay in place for 2 weeks or longer. If adhesive strip edges start to loosen and curl up, you may trim the loose edges. Do not remove adhesive strips completely unless your health care provider tells you to do that. °· Check your incision area every day for signs of infection. Check for: °? More redness, swelling, or pain. °? More fluid or blood. °? Warmth. °? Pus or a bad smell. °Activity °· Do not drive or use heavy machinery while taking prescription pain medicine. Do not drive until your health care provider approves. °· Until your health care provider approves: °? Do not lift anything that is heavier than 10 lb (4.5 kg). °? Do not play contact sports. °· Return to your normal activities as told by your health care provider. Ask your health care provider what activities are safe. °General instructions °· To prevent or treat constipation while you are taking prescription pain medicine, your health care provider may recommend that you: °? Drink enough fluid to keep your urine clear or pale yellow. °? Take over-the-counter or  prescription medicines. °? Eat foods that are high in fiber, such as fresh fruits and vegetables, whole grains, and beans. °? Limit foods that are high in fat and processed sugars, such as fried and sweet foods. °· Take over-the-counter and prescription medicines only as told by your health care provider. °· Do not take tub baths or go swimming until your health care provider approves. °· Keep all follow-up visits as told by your health care provider. This is important. °Contact a health care provider if: °· You develop a rash. °· You have more redness, swelling, or pain around your incision. °· You have more fluid or blood coming from your incision. °· Your incision feels warm to the touch. °· You have pus or a bad smell coming from your incision. °· You have a fever or chills. °· You have blood in your stool (feces). °· You have not had a bowel movement in 2-3 days. °· Your pain is not controlled with medicine. °Get help right away if: °· You have chest pain or shortness of breath. °· You feel light-headed or feel faint. °· You have severe pain. °· You vomit and your pain is worse. °This information is not intended to replace advice given to you by your health care provider. Make sure you discuss any questions you have with your health care provider. °Document Released: 10/23/2004 Document Revised: 10/24/2015 Document Reviewed: 09/17/2015 °Elsevier Interactive Patient Education © 2017 [employer] ° °PATIENT INSTRUCTIONS °POST-ANESTHESIA ° °IMMEDIATELY FOLLOWING SURGERY:  Do not drive or operate machinery for the   first twenty four hours after surgery.  Do not make any important decisions for twenty four hours after surgery or while taking narcotic pain medications or sedatives.  If you develop intractable nausea and vomiting or a severe headache please notify your doctor immediately. ° °FOLLOW-UP:  Please make an appointment with your surgeon as instructed. You do not need to follow up with anesthesia unless  specifically instructed to do so. ° °WOUND CARE INSTRUCTIONS (if applicable):  Keep a dry clean dressing on the anesthesia/puncture wound site if there is drainage.  Once the wound has quit draining you may leave it open to air.  Generally you should leave the bandage intact for twenty four hours unless there is drainage.  If the epidural site drains for more than 36-48 hours please call the anesthesia department. ° °QUESTIONS?:  Please feel free to call your physician or the hospital operator if you have any questions, and they will be happy to assist you.    ° ° ° ° °

## 2016-11-22 ENCOUNTER — Encounter (HOSPITAL_COMMUNITY): Payer: Self-pay | Admitting: General Surgery

## 2016-12-02 ENCOUNTER — Encounter: Payer: Self-pay | Admitting: General Surgery

## 2016-12-02 ENCOUNTER — Ambulatory Visit (INDEPENDENT_AMBULATORY_CARE_PROVIDER_SITE_OTHER): Payer: Self-pay | Admitting: General Surgery

## 2016-12-02 VITALS — BP 173/85 | HR 73 | Temp 99.1°F | Resp 18 | Ht 74.0 in | Wt 176.0 lb

## 2016-12-02 DIAGNOSIS — Z09 Encounter for follow-up examination after completed treatment for conditions other than malignant neoplasm: Secondary | ICD-10-CM

## 2016-12-02 NOTE — Progress Notes (Signed)
Subjective:     Troy Sanders  Status post right inguinal herniorrhaphy with mesh. Doing well. Minimal pain. Objective:    BP (!) 173/85   Pulse 73   Temp 99.1 F (37.3 C)   Resp 18   Ht 6\' 2"  (1.88 m)   Wt 176 lb (79.8 kg)   BMI 22.60 kg/m   General:  alert, cooperative and no distress  Right inguinal incision healing well.     Assessment:    Doing well postoperatively.    Plan:   Increase activity as able. Follow-up as needed.

## 2016-12-03 DIAGNOSIS — I10 Essential (primary) hypertension: Secondary | ICD-10-CM | POA: Diagnosis not present

## 2016-12-03 DIAGNOSIS — Z8551 Personal history of malignant neoplasm of bladder: Secondary | ICD-10-CM | POA: Diagnosis not present

## 2017-02-10 DIAGNOSIS — Z23 Encounter for immunization: Secondary | ICD-10-CM | POA: Diagnosis not present

## 2017-05-02 DIAGNOSIS — E291 Testicular hypofunction: Secondary | ICD-10-CM | POA: Diagnosis not present

## 2017-05-02 DIAGNOSIS — R7301 Impaired fasting glucose: Secondary | ICD-10-CM | POA: Diagnosis not present

## 2017-05-02 DIAGNOSIS — J449 Chronic obstructive pulmonary disease, unspecified: Secondary | ICD-10-CM | POA: Diagnosis not present

## 2017-05-02 DIAGNOSIS — I10 Essential (primary) hypertension: Secondary | ICD-10-CM | POA: Diagnosis not present

## 2017-06-02 DIAGNOSIS — J449 Chronic obstructive pulmonary disease, unspecified: Secondary | ICD-10-CM | POA: Diagnosis not present

## 2017-06-02 DIAGNOSIS — I10 Essential (primary) hypertension: Secondary | ICD-10-CM | POA: Diagnosis not present

## 2017-10-27 DIAGNOSIS — I10 Essential (primary) hypertension: Secondary | ICD-10-CM | POA: Diagnosis not present

## 2017-10-27 DIAGNOSIS — R7301 Impaired fasting glucose: Secondary | ICD-10-CM | POA: Diagnosis not present

## 2017-10-27 DIAGNOSIS — J449 Chronic obstructive pulmonary disease, unspecified: Secondary | ICD-10-CM | POA: Diagnosis not present

## 2017-10-27 DIAGNOSIS — E291 Testicular hypofunction: Secondary | ICD-10-CM | POA: Diagnosis not present

## 2017-11-01 DIAGNOSIS — H6122 Impacted cerumen, left ear: Secondary | ICD-10-CM | POA: Diagnosis not present

## 2017-11-01 DIAGNOSIS — I10 Essential (primary) hypertension: Secondary | ICD-10-CM | POA: Diagnosis not present

## 2017-11-01 DIAGNOSIS — R7303 Prediabetes: Secondary | ICD-10-CM | POA: Diagnosis not present

## 2017-11-01 DIAGNOSIS — J449 Chronic obstructive pulmonary disease, unspecified: Secondary | ICD-10-CM | POA: Diagnosis not present

## 2017-11-01 DIAGNOSIS — L309 Dermatitis, unspecified: Secondary | ICD-10-CM | POA: Diagnosis not present

## 2018-03-23 DIAGNOSIS — R7303 Prediabetes: Secondary | ICD-10-CM | POA: Diagnosis not present

## 2018-03-23 DIAGNOSIS — I10 Essential (primary) hypertension: Secondary | ICD-10-CM | POA: Diagnosis not present

## 2018-03-23 DIAGNOSIS — J449 Chronic obstructive pulmonary disease, unspecified: Secondary | ICD-10-CM | POA: Diagnosis not present

## 2018-04-18 DIAGNOSIS — Z0000 Encounter for general adult medical examination without abnormal findings: Secondary | ICD-10-CM | POA: Diagnosis not present

## 2018-04-24 DIAGNOSIS — R7301 Impaired fasting glucose: Secondary | ICD-10-CM | POA: Diagnosis not present

## 2018-04-24 DIAGNOSIS — I10 Essential (primary) hypertension: Secondary | ICD-10-CM | POA: Diagnosis not present

## 2018-04-24 DIAGNOSIS — Z0000 Encounter for general adult medical examination without abnormal findings: Secondary | ICD-10-CM | POA: Diagnosis not present

## 2018-04-24 DIAGNOSIS — J449 Chronic obstructive pulmonary disease, unspecified: Secondary | ICD-10-CM | POA: Diagnosis not present

## 2018-04-25 DIAGNOSIS — J449 Chronic obstructive pulmonary disease, unspecified: Secondary | ICD-10-CM | POA: Diagnosis not present

## 2018-04-25 DIAGNOSIS — I10 Essential (primary) hypertension: Secondary | ICD-10-CM | POA: Diagnosis not present

## 2018-04-25 DIAGNOSIS — R7301 Impaired fasting glucose: Secondary | ICD-10-CM | POA: Diagnosis not present

## 2018-04-28 ENCOUNTER — Other Ambulatory Visit: Payer: Self-pay

## 2018-04-28 ENCOUNTER — Encounter (HOSPITAL_COMMUNITY): Payer: Self-pay

## 2018-04-28 ENCOUNTER — Emergency Department (HOSPITAL_COMMUNITY)
Admission: EM | Admit: 2018-04-28 | Discharge: 2018-04-28 | Disposition: A | Discharge status: Home / Self Care | Payer: Medicare Other | Attending: Emergency Medicine | Admitting: Emergency Medicine

## 2018-04-28 DIAGNOSIS — N4889 Other specified disorders of penis: Secondary | ICD-10-CM | POA: Diagnosis present

## 2018-04-28 DIAGNOSIS — Z79899 Other long term (current) drug therapy: Secondary | ICD-10-CM | POA: Insufficient documentation

## 2018-04-28 DIAGNOSIS — I10 Essential (primary) hypertension: Secondary | ICD-10-CM | POA: Insufficient documentation

## 2018-04-28 DIAGNOSIS — J449 Chronic obstructive pulmonary disease, unspecified: Secondary | ICD-10-CM | POA: Insufficient documentation

## 2018-04-28 DIAGNOSIS — N4822 Cellulitis of corpus cavernosum and penis: Secondary | ICD-10-CM

## 2018-04-28 DIAGNOSIS — F17210 Nicotine dependence, cigarettes, uncomplicated: Secondary | ICD-10-CM | POA: Diagnosis not present

## 2018-04-28 MED ORDER — AMOXICILLIN-POT CLAVULANATE 875-125 MG PO TABS
1.0000 | ORAL_TABLET | Freq: Once | ORAL | Status: AC
  Administered 2018-04-28: 1 via ORAL
  Filled 2018-04-28: qty 1

## 2018-04-28 MED ORDER — AMOXICILLIN-POT CLAVULANATE 875-125 MG PO TABS: 1.0000 | ORAL_TABLET | Freq: Two times a day (BID) | ORAL | 0 refills | Status: DC

## 2018-04-28 NOTE — Discharge Instructions (Signed)
Return immediately for worsening swelling, redness, fever or any concerns.

## 2018-04-28 NOTE — ED Provider Notes (Signed)
[facility] EMERGENCY DEPARTMENT Provider Note   CSN: [id number] Arrival date & time: 04/28/18  1036     History   Chief Complaint Chief Complaint  Patient presents with  . Groin Pain    HPI Troy Sanders is a 73 y.o. male.  HPI Patient presents with 4 days of redness, swelling and tenderness to his penis.  Noticed a pustule on the penis yesterday which appeared to drain.  No penile discharge.  States he received oral sex 4 days ago and thinks he may have sustained a bite injury.  Denies any fever or chills. Past Medical History:  Diagnosis Date  . Anxiety   . COPD (chronic obstructive pulmonary disease) ([place])   . DJD (degenerative joint disease)   . Hypertension     Patient Active Problem List   Diagnosis Date Noted  . Non-recurrent unilateral inguinal hernia without obstruction or gangrene   . Essential hypertension, benign 07/04/2013  . COPD, moderate ([place]) 12/23/2012  . Tobacco user 10/02/2011    Past Surgical History:  Procedure Laterality Date  . BLADDER SURGERY     x2  . HERNIA REPAIR Left 1995  . INGUINAL HERNIA REPAIR Right 11/19/2016   Procedure: RIGHT INGUINAL HERNIORRHAPHY WITH MESH;  Surgeon: Aviva Signs, MD;  Location: [facility];  Service: General;  Laterality: Right;        Home Medications    Prior to Admission medications   Medication Sig Start Date End Date Taking? Authorizing Provider  albuterol (PROAIR HFA) 108 (90 BASE) MCG/ACT inhaler Inhale 2 puffs into the lungs every 6 (six) hours as needed for wheezing or shortness of breath. 07/18/13   [place], Modena Nunnery, MD  ALPRAZolam Duanne Moron) 0.5 MG tablet Take 1 tablet by mouth 2 (two) times daily as needed for anxiety.  11/09/16   [provider]  amLODipine (NORVASC) 5 MG tablet Take 1 tablet (5 mg total) by mouth daily. 12/17/13   Alycia Rossetti, MD  amoxicillin-clavulanate (AUGMENTIN) 875-125 MG tablet Take 1 tablet by mouth 2 (two) times daily. One po bid x 7 days 04/28/18   Julianne Rice, MD  budesonide-formoterol [facility]) 160-4.5 MCG/ACT inhaler Inhale 2 puffs into the lungs 2 (two) times daily. 07/18/13   Alycia Rossetti, MD  HYDROcodone-acetaminophen (NORCO) 7.5-325 MG tablet Take 1-2 tablets by mouth every 6 (six) hours as needed for moderate pain. 11/19/16   Aviva Signs, MD    Family History Family History  Problem Relation Age of Onset  . Heart disease Father     Social History Social History   Tobacco Use  . Smoking status: Current Every Day Smoker    Packs/day: 1.00    Years: 55.00    Pack years: 55.00    Types: Cigarettes  . Smokeless tobacco: Never Used  . Tobacco comment: 1- 1.5 packs per day  Substance Use Topics  . Alcohol use: No  . Drug use: No     Allergies   Patient has no known allergies.   Review of Systems Review of Systems  Gastrointestinal: Negative for abdominal pain, nausea and vomiting.  Genitourinary: Positive for penile pain and penile swelling. Negative for discharge, dysuria, flank pain, frequency, hematuria, scrotal swelling and testicular pain.  Skin: Positive for color change. Negative for rash and wound.  All other systems reviewed and are negative.    Physical Exam Updated Vital Signs BP (!) 148/89   Pulse 73   Temp 97.7 F (36.5 C) (Oral)   Resp 18  Ht 6\' 2"  (1.88 m)   Wt 78 kg   SpO2 95%   BMI 22.08 kg/m   Physical Exam Vitals signs and nursing note reviewed.  Constitutional:      Appearance: Normal appearance. He is well-developed.  HENT:     Head: Normocephalic and atraumatic.  Eyes:     Pupils: Pupils are equal, round, and reactive to light.  Neck:     Musculoskeletal: Normal range of motion and neck supple.  Cardiovascular:     Rate and Rhythm: Normal rate and regular rhythm.  Pulmonary:     Effort: Pulmonary effort is normal.     Breath sounds: Normal breath sounds.  Abdominal:     General: Bowel sounds are normal.     Palpations: Abdomen is soft.     Tenderness: There is no  abdominal tenderness. There is no guarding or rebound.  Genitourinary:    Comments: Uncircumcised penis.  Patient has thickening of the foreskin with small pustule and surrounding erythema.  No fluctuant masses.  No penile discharge.  Foreskin is easily retracted.  No lymphadenopathy.  No testicular swelling or tenderness. Musculoskeletal: Normal range of motion.        General: No tenderness.  Skin:    General: Skin is warm and dry.     Findings: No erythema or rash.  Neurological:     Mental Status: He is alert and oriented to person, place, and time.  Psychiatric:        Behavior: Behavior normal.      ED Treatments / Results  Labs (all labs ordered are listed, but only abnormal results are displayed) Labs Reviewed - No data to display  EKG None  Radiology No results found.  Procedures Procedures (including critical care time)  Medications Ordered in ED Medications  amoxicillin-clavulanate (AUGMENTIN) 875-125 MG per tablet 1 tablet (has no administration in time range)     Initial Impression / Assessment and Plan / ED Course  I have reviewed the triage vital signs and the nursing notes.  Pertinent labs & imaging results that were available during my care of the patient were reviewed by me and considered in my medical decision making (see chart for details).     No obvious trauma.  Patient does have a pustule to the foreskin with surrounding erythema concerning for infection.  Given possible bite injury will cover with Augmentin.  He has been given strict return precautions and has voiced understanding.  Final Clinical Impressions(s) / ED Diagnoses   Final diagnoses:  Penile cellulitis    ED Discharge Orders         Ordered    amoxicillin-clavulanate (AUGMENTIN) 875-125 MG tablet  2 times daily     04/28/18 1231           Julianne Rice, MD 04/28/18 1231

## 2018-04-28 NOTE — ED Triage Notes (Signed)
Pt presents to ED with bruising to penis. Pt states he noticed the bruising to penis 4 days ago, pt also states swelling and pus coming from penis.

## 2018-05-01 DIAGNOSIS — I10 Essential (primary) hypertension: Secondary | ICD-10-CM | POA: Diagnosis not present

## 2018-05-01 DIAGNOSIS — H6122 Impacted cerumen, left ear: Secondary | ICD-10-CM | POA: Diagnosis not present

## 2018-05-01 DIAGNOSIS — J449 Chronic obstructive pulmonary disease, unspecified: Secondary | ICD-10-CM | POA: Diagnosis not present

## 2018-05-06 DIAGNOSIS — H6121 Impacted cerumen, right ear: Secondary | ICD-10-CM | POA: Diagnosis not present

## 2018-05-06 DIAGNOSIS — H9191 Unspecified hearing loss, right ear: Secondary | ICD-10-CM | POA: Diagnosis not present

## 2018-05-12 ENCOUNTER — Ambulatory Visit: Payer: Medicare Other | Admitting: Urology

## 2018-06-01 DIAGNOSIS — J449 Chronic obstructive pulmonary disease, unspecified: Secondary | ICD-10-CM | POA: Diagnosis not present

## 2018-06-01 DIAGNOSIS — I10 Essential (primary) hypertension: Secondary | ICD-10-CM | POA: Diagnosis not present

## 2018-06-01 DIAGNOSIS — Z9181 History of falling: Secondary | ICD-10-CM | POA: Diagnosis not present

## 2018-07-25 DIAGNOSIS — J449 Chronic obstructive pulmonary disease, unspecified: Secondary | ICD-10-CM | POA: Diagnosis not present

## 2018-07-25 DIAGNOSIS — I10 Essential (primary) hypertension: Secondary | ICD-10-CM | POA: Diagnosis not present

## 2018-07-25 DIAGNOSIS — R7301 Impaired fasting glucose: Secondary | ICD-10-CM | POA: Diagnosis not present

## 2018-08-22 DIAGNOSIS — Z0001 Encounter for general adult medical examination with abnormal findings: Secondary | ICD-10-CM | POA: Diagnosis not present

## 2018-08-22 DIAGNOSIS — J449 Chronic obstructive pulmonary disease, unspecified: Secondary | ICD-10-CM | POA: Diagnosis not present

## 2018-11-08 DIAGNOSIS — R7301 Impaired fasting glucose: Secondary | ICD-10-CM | POA: Diagnosis not present

## 2018-11-08 DIAGNOSIS — R7303 Prediabetes: Secondary | ICD-10-CM | POA: Diagnosis not present

## 2018-11-08 DIAGNOSIS — I10 Essential (primary) hypertension: Secondary | ICD-10-CM | POA: Diagnosis not present

## 2018-11-13 DIAGNOSIS — I10 Essential (primary) hypertension: Secondary | ICD-10-CM | POA: Diagnosis not present

## 2018-11-13 DIAGNOSIS — R7301 Impaired fasting glucose: Secondary | ICD-10-CM | POA: Diagnosis not present

## 2018-11-13 DIAGNOSIS — J449 Chronic obstructive pulmonary disease, unspecified: Secondary | ICD-10-CM | POA: Diagnosis not present

## 2018-11-23 ENCOUNTER — Emergency Department (HOSPITAL_COMMUNITY)
Admission: EM | Admit: 2018-11-23 | Discharge: 2018-11-23 | Disposition: A | Discharge status: Home / Self Care | Payer: Medicare Other | Attending: Emergency Medicine | Admitting: Emergency Medicine

## 2018-11-23 ENCOUNTER — Encounter (HOSPITAL_COMMUNITY): Payer: Self-pay

## 2018-11-23 ENCOUNTER — Other Ambulatory Visit: Payer: Self-pay

## 2018-11-23 ENCOUNTER — Emergency Department (HOSPITAL_COMMUNITY): Payer: Medicare Other

## 2018-11-23 DIAGNOSIS — Y92414 Local residential or business street as the place of occurrence of the external cause: Secondary | ICD-10-CM | POA: Diagnosis not present

## 2018-11-23 DIAGNOSIS — S01511A Laceration without foreign body of lip, initial encounter: Secondary | ICD-10-CM | POA: Diagnosis not present

## 2018-11-23 DIAGNOSIS — Y9389 Activity, other specified: Secondary | ICD-10-CM | POA: Insufficient documentation

## 2018-11-23 DIAGNOSIS — Z23 Encounter for immunization: Secondary | ICD-10-CM | POA: Diagnosis not present

## 2018-11-23 DIAGNOSIS — S02411A LeFort I fracture, initial encounter for closed fracture: Secondary | ICD-10-CM | POA: Diagnosis not present

## 2018-11-23 DIAGNOSIS — J449 Chronic obstructive pulmonary disease, unspecified: Secondary | ICD-10-CM | POA: Insufficient documentation

## 2018-11-23 DIAGNOSIS — S098XXA Other specified injuries of head, initial encounter: Secondary | ICD-10-CM | POA: Diagnosis present

## 2018-11-23 DIAGNOSIS — F17210 Nicotine dependence, cigarettes, uncomplicated: Secondary | ICD-10-CM | POA: Diagnosis not present

## 2018-11-23 DIAGNOSIS — M542 Cervicalgia: Secondary | ICD-10-CM | POA: Diagnosis not present

## 2018-11-23 DIAGNOSIS — I10 Essential (primary) hypertension: Secondary | ICD-10-CM | POA: Diagnosis not present

## 2018-11-23 DIAGNOSIS — S199XXA Unspecified injury of neck, initial encounter: Secondary | ICD-10-CM | POA: Diagnosis not present

## 2018-11-23 DIAGNOSIS — Z79899 Other long term (current) drug therapy: Secondary | ICD-10-CM | POA: Insufficient documentation

## 2018-11-23 DIAGNOSIS — Y999 Unspecified external cause status: Secondary | ICD-10-CM | POA: Diagnosis not present

## 2018-11-23 DIAGNOSIS — S0990XA Unspecified injury of head, initial encounter: Secondary | ICD-10-CM | POA: Diagnosis not present

## 2018-11-23 MED ORDER — AMOXICILLIN 500 MG PO CAPS: 500.0000 mg | ORAL_CAPSULE | Freq: Three times a day (TID) | ORAL | 0 refills | Status: DC

## 2018-11-23 MED ORDER — OXYCODONE-ACETAMINOPHEN 5-325 MG PO TABS: 1.0000 | ORAL_TABLET | ORAL | 0 refills | Status: DC | PRN

## 2018-11-23 MED ORDER — OXYCODONE-ACETAMINOPHEN 5-325 MG PO TABS
1.0000 | ORAL_TABLET | Freq: Once | ORAL | Status: AC
  Administered 2018-11-23: 05:00:00 1 via ORAL
  Filled 2018-11-23: qty 1

## 2018-11-23 MED ORDER — TETANUS-DIPHTH-ACELL PERTUSSIS 5-2.5-18.5 LF-MCG/0.5 IM SUSP
0.5000 mL | Freq: Once | INTRAMUSCULAR | Status: AC
  Administered 2018-11-23: 0.5 mL via INTRAMUSCULAR
  Filled 2018-11-23: qty 0.5
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
## 2018-11-23 NOTE — Discharge Instructions (Signed)
You have fractures in your face.  These fractures are around your mouth and will make it difficult for you to eat.  You need to stay on a soft diet, do not chew anything hard.  Do not blow your nose for 1 week.  Please call Dr. Trish Mage office this morning as one of the doctors in the office will see you today or tomorrow for a recheck.

## 2018-11-27 DIAGNOSIS — K08109 Complete loss of teeth, unspecified cause, unspecified class: Secondary | ICD-10-CM | POA: Diagnosis not present

## 2018-11-27 DIAGNOSIS — S02411D LeFort I fracture, subsequent encounter for fracture with routine healing: Secondary | ICD-10-CM | POA: Diagnosis not present

## 2018-12-05 DIAGNOSIS — I10 Essential (primary) hypertension: Secondary | ICD-10-CM | POA: Diagnosis not present

## 2018-12-05 DIAGNOSIS — J449 Chronic obstructive pulmonary disease, unspecified: Secondary | ICD-10-CM | POA: Diagnosis not present

## 2018-12-18 DIAGNOSIS — K08109 Complete loss of teeth, unspecified cause, unspecified class: Secondary | ICD-10-CM | POA: Diagnosis not present

## 2018-12-18 DIAGNOSIS — S02411D LeFort I fracture, subsequent encounter for fracture with routine healing: Secondary | ICD-10-CM | POA: Diagnosis not present

## 2018-12-26 DIAGNOSIS — L219 Seborrheic dermatitis, unspecified: Secondary | ICD-10-CM | POA: Diagnosis not present

## 2018-12-26 DIAGNOSIS — J449 Chronic obstructive pulmonary disease, unspecified: Secondary | ICD-10-CM | POA: Diagnosis not present

## 2018-12-26 DIAGNOSIS — I10 Essential (primary) hypertension: Secondary | ICD-10-CM | POA: Diagnosis not present

## 2019-01-01 DIAGNOSIS — I10 Essential (primary) hypertension: Secondary | ICD-10-CM | POA: Diagnosis not present

## 2019-01-01 DIAGNOSIS — L219 Seborrheic dermatitis, unspecified: Secondary | ICD-10-CM | POA: Diagnosis not present

## 2019-01-01 DIAGNOSIS — J449 Chronic obstructive pulmonary disease, unspecified: Secondary | ICD-10-CM | POA: Diagnosis not present

## 2019-01-31 DIAGNOSIS — L219 Seborrheic dermatitis, unspecified: Secondary | ICD-10-CM | POA: Diagnosis not present

## 2019-01-31 DIAGNOSIS — Z1212 Encounter for screening for malignant neoplasm of rectum: Secondary | ICD-10-CM | POA: Diagnosis not present

## 2019-01-31 DIAGNOSIS — I10 Essential (primary) hypertension: Secondary | ICD-10-CM | POA: Diagnosis not present

## 2019-01-31 DIAGNOSIS — J449 Chronic obstructive pulmonary disease, unspecified: Secondary | ICD-10-CM | POA: Diagnosis not present

## 2019-01-31 DIAGNOSIS — Z1211 Encounter for screening for malignant neoplasm of colon: Secondary | ICD-10-CM | POA: Diagnosis not present

## 2019-03-06 DIAGNOSIS — I10 Essential (primary) hypertension: Secondary | ICD-10-CM | POA: Diagnosis not present

## 2019-03-06 DIAGNOSIS — J449 Chronic obstructive pulmonary disease, unspecified: Secondary | ICD-10-CM | POA: Diagnosis not present

## 2019-03-06 DIAGNOSIS — L219 Seborrheic dermatitis, unspecified: Secondary | ICD-10-CM | POA: Diagnosis not present

## 2019-03-30 DIAGNOSIS — J449 Chronic obstructive pulmonary disease, unspecified: Secondary | ICD-10-CM | POA: Diagnosis not present

## 2019-03-30 DIAGNOSIS — I10 Essential (primary) hypertension: Secondary | ICD-10-CM | POA: Diagnosis not present

## 2019-04-11 DIAGNOSIS — I10 Essential (primary) hypertension: Secondary | ICD-10-CM | POA: Diagnosis not present

## 2019-04-11 DIAGNOSIS — R7303 Prediabetes: Secondary | ICD-10-CM | POA: Diagnosis not present

## 2019-04-11 DIAGNOSIS — R7301 Impaired fasting glucose: Secondary | ICD-10-CM | POA: Diagnosis not present

## 2019-04-23 DIAGNOSIS — R7301 Impaired fasting glucose: Secondary | ICD-10-CM | POA: Diagnosis not present

## 2019-04-23 DIAGNOSIS — J449 Chronic obstructive pulmonary disease, unspecified: Secondary | ICD-10-CM | POA: Diagnosis not present

## 2019-04-23 DIAGNOSIS — I10 Essential (primary) hypertension: Secondary | ICD-10-CM | POA: Diagnosis not present

## 2019-04-23 DIAGNOSIS — L219 Seborrheic dermatitis, unspecified: Secondary | ICD-10-CM | POA: Diagnosis not present

## 2019-04-24 DIAGNOSIS — I10 Essential (primary) hypertension: Secondary | ICD-10-CM | POA: Diagnosis not present

## 2019-04-24 DIAGNOSIS — R7301 Impaired fasting glucose: Secondary | ICD-10-CM | POA: Diagnosis not present

## 2019-04-24 DIAGNOSIS — J449 Chronic obstructive pulmonary disease, unspecified: Secondary | ICD-10-CM | POA: Diagnosis not present

## 2019-04-24 DIAGNOSIS — R21 Rash and other nonspecific skin eruption: Secondary | ICD-10-CM | POA: Diagnosis not present

## 2019-04-24 DIAGNOSIS — M19049 Primary osteoarthritis, unspecified hand: Secondary | ICD-10-CM | POA: Diagnosis not present

## 2019-06-07 DIAGNOSIS — J449 Chronic obstructive pulmonary disease, unspecified: Secondary | ICD-10-CM | POA: Diagnosis not present

## 2019-06-07 DIAGNOSIS — I10 Essential (primary) hypertension: Secondary | ICD-10-CM | POA: Diagnosis not present

## 2019-06-29 DIAGNOSIS — J449 Chronic obstructive pulmonary disease, unspecified: Secondary | ICD-10-CM | POA: Diagnosis not present

## 2019-06-29 DIAGNOSIS — I10 Essential (primary) hypertension: Secondary | ICD-10-CM | POA: Diagnosis not present

## 2019-07-31 DIAGNOSIS — J449 Chronic obstructive pulmonary disease, unspecified: Secondary | ICD-10-CM | POA: Diagnosis not present

## 2019-07-31 DIAGNOSIS — I10 Essential (primary) hypertension: Secondary | ICD-10-CM | POA: Diagnosis not present

## 2019-08-20 DIAGNOSIS — I10 Essential (primary) hypertension: Secondary | ICD-10-CM | POA: Diagnosis not present

## 2019-08-20 DIAGNOSIS — J449 Chronic obstructive pulmonary disease, unspecified: Secondary | ICD-10-CM | POA: Diagnosis not present

## 2019-08-20 DIAGNOSIS — Z720 Tobacco use: Secondary | ICD-10-CM | POA: Diagnosis not present

## 2019-08-21 DIAGNOSIS — I10 Essential (primary) hypertension: Secondary | ICD-10-CM | POA: Diagnosis not present

## 2019-08-21 DIAGNOSIS — R7301 Impaired fasting glucose: Secondary | ICD-10-CM | POA: Diagnosis not present

## 2019-08-21 DIAGNOSIS — L219 Seborrheic dermatitis, unspecified: Secondary | ICD-10-CM | POA: Diagnosis not present

## 2019-08-21 DIAGNOSIS — J449 Chronic obstructive pulmonary disease, unspecified: Secondary | ICD-10-CM | POA: Diagnosis not present

## 2019-09-17 ENCOUNTER — Emergency Department (HOSPITAL_COMMUNITY)
Admission: EM | Admit: 2019-09-17 | Discharge: 2019-09-17 | Disposition: A | Discharge status: Home / Self Care | Payer: Medicare Other | Attending: Emergency Medicine | Admitting: Emergency Medicine

## 2019-09-17 ENCOUNTER — Other Ambulatory Visit: Payer: Self-pay

## 2019-09-17 ENCOUNTER — Encounter (HOSPITAL_COMMUNITY): Payer: Self-pay | Admitting: *Deleted

## 2019-09-17 ENCOUNTER — Emergency Department (HOSPITAL_COMMUNITY): Payer: Medicare Other

## 2019-09-17 DIAGNOSIS — M5432 Sciatica, left side: Secondary | ICD-10-CM

## 2019-09-17 DIAGNOSIS — I10 Essential (primary) hypertension: Secondary | ICD-10-CM | POA: Insufficient documentation

## 2019-09-17 DIAGNOSIS — M545 Low back pain: Secondary | ICD-10-CM | POA: Diagnosis not present

## 2019-09-17 DIAGNOSIS — M5441 Lumbago with sciatica, right side: Secondary | ICD-10-CM | POA: Diagnosis not present

## 2019-09-17 DIAGNOSIS — Z79899 Other long term (current) drug therapy: Secondary | ICD-10-CM | POA: Diagnosis not present

## 2019-09-17 DIAGNOSIS — F17210 Nicotine dependence, cigarettes, uncomplicated: Secondary | ICD-10-CM | POA: Insufficient documentation

## 2019-09-17 DIAGNOSIS — J449 Chronic obstructive pulmonary disease, unspecified: Secondary | ICD-10-CM | POA: Diagnosis not present

## 2019-09-17 DIAGNOSIS — M5442 Lumbago with sciatica, left side: Secondary | ICD-10-CM | POA: Diagnosis not present

## 2019-09-17 DIAGNOSIS — M5431 Sciatica, right side: Secondary | ICD-10-CM

## 2019-09-17 MED ORDER — DEXAMETHASONE SODIUM PHOSPHATE 10 MG/ML IJ SOLN
10.0000 mg | Freq: Once | INTRAMUSCULAR | Status: AC
  Administered 2019-09-17: 10 mg via INTRAMUSCULAR
  Filled 2019-09-17: qty 1

## 2019-09-17 MED ORDER — MELOXICAM 7.5 MG PO TABS: 7.5000 mg | ORAL_TABLET | Freq: Two times a day (BID) | ORAL | 0 refills | Status: DC | PRN

## 2019-09-17 MED ORDER — PREDNISONE 20 MG PO TABS: 40.0000 mg | ORAL_TABLET | Freq: Every day | ORAL | 0 refills | Status: DC

## 2019-09-17 MED ORDER — MELOXICAM 7.5 MG PO TABS: 7.5000 mg | ORAL_TABLET | Freq: Two times a day (BID) | ORAL | 0 refills | Status: AC | PRN

## 2019-09-17 MED ORDER — KETOROLAC TROMETHAMINE 60 MG/2ML IM SOLN
60.0000 mg | Freq: Once | INTRAMUSCULAR | Status: AC
  Administered 2019-09-17: 60 mg via INTRAMUSCULAR
  Filled 2019-09-17: qty 2

## 2019-09-17 NOTE — ED Triage Notes (Signed)
Pt c/o lower back pain that radiates down both legs; pt states no position helps the pain

## 2019-09-17 NOTE — Discharge Instructions (Signed)
If you develop worsening symptoms return to the emergency department, I would expect your back to hurt and the pain to go down your legs for the next week but it should gradually get better with the medications including prednisone daily for 5 days and Mobic twice a day as needed for pain.  Please rest, get plenty of fluids.  You do have some constipation on your x-ray.  I would encourage you to follow-up with your doctor for an MRI if the pain does not get any better however if you develop increasing pain, weakness or numbness of the legs you should return to the emergency department immediately

## 2019-09-17 NOTE — ED Provider Notes (Signed)
[facility] EMERGENCY DEPARTMENT Provider Note   CSN: [id number] Arrival date & time: 09/17/19  1026     History Chief Complaint  Patient presents with  . Back Pain    Troy Sanders is a 74 y.o. male.  HPI   This patient is a 74 year old male, known history of COPD, hypertension, he is a longtime smoker.  He has had occasional back pain in the past but over the last week has developed some midline low back pain which seems to be across his lower back and until 3 days ago was under reasonable control.  He notes that approximately 3 days ago he developed pain that was going from the back down through bilateral buttocks and now down both legs to his feet.  He has no numbness, he walks with a cane and that has not worsened.  His pain is exacerbated by certain positions and improved after he took some pain medication this morning including an oxycodone that was left over from an inguinal hernia repair last year.  The patient denies any urinary or bladder retention or incontinence, he has no difficulty with abdominal pain, no fevers or chills, no history of cancer, no history of IV drug use.  No history of back surgery.  He has not sought medical care for this prior to today's visit  Past Medical History:  Diagnosis Date  . Anxiety   . COPD (chronic obstructive pulmonary disease) ([place])   . DJD (degenerative joint disease)   . Hypertension     Patient Active Problem List   Diagnosis Date Noted  . Non-recurrent unilateral inguinal hernia without obstruction or gangrene   . Essential hypertension, benign 07/04/2013  . COPD, moderate ([place]) 12/23/2012  . Tobacco user 10/02/2011    Past Surgical History:  Procedure Laterality Date  . BLADDER SURGERY     x2  . HERNIA REPAIR Left 1995  . INGUINAL HERNIA REPAIR Right 11/19/2016   Procedure: RIGHT INGUINAL HERNIORRHAPHY WITH MESH;  Surgeon: Aviva Signs, MD;  Location: [facility];  Service: General;  Laterality: Right;       Family  History  Problem Relation Age of Onset  . Heart disease Father     Social History   Tobacco Use  . Smoking status: Current Every Day Smoker    Packs/day: 1.00    Years: 55.00    Pack years: 55.00    Types: Cigarettes  . Smokeless tobacco: Never Used  . Tobacco comment: 1- 1.5 packs per day  Substance Use Topics  . Alcohol use: No  . Drug use: No    Home Medications Prior to Admission medications   Medication Sig Start Date End Date Taking? Authorizing Provider  albuterol (PROAIR HFA) 108 (90 BASE) MCG/ACT inhaler Inhale 2 puffs into the lungs every 6 (six) hours as needed for wheezing or shortness of breath. 07/18/13   [place], Modena Nunnery, MD  ALPRAZolam Duanne Moron) 0.5 MG tablet Take 1 tablet by mouth 2 (two) times daily as needed for anxiety.  11/09/16   [provider]  amLODipine (NORVASC) 5 MG tablet Take 1 tablet (5 mg total) by mouth daily. 12/17/13   Alycia Rossetti, MD  amoxicillin (AMOXIL) 500 MG capsule Take 1 capsule (500 mg total) by mouth 3 (three) times daily. 11/23/18   Orpah Greek, MD  amoxicillin-clavulanate (AUGMENTIN) 875-125 MG tablet Take 1 tablet by mouth 2 (two) times daily. One po bid x 7 days 04/28/18   Julianne Rice, MD  budesonide-formoterol [facility])  160-4.5 MCG/ACT inhaler Inhale 2 puffs into the lungs 2 (two) times daily. 07/18/13   Alycia Rossetti, MD  HYDROcodone-acetaminophen (NORCO) 7.5-325 MG tablet Take 1-2 tablets by mouth every 6 (six) hours as needed for moderate pain. 11/19/16   Aviva Signs, MD  meloxicam (MOBIC) 7.5 MG tablet Take 1 tablet (7.5 mg total) by mouth 2 (two) times daily as needed for up to 14 days for pain. 09/17/19 10/01/19  Noemi Chapel, MD  oxyCODONE-acetaminophen (PERCOCET) 5-325 MG tablet Take 1 tablet by mouth every 4 (four) hours as needed. 11/23/18   Orpah Greek, MD  predniSONE (DELTASONE) 20 MG tablet Take 2 tablets (40 mg total) by mouth daily. 09/17/19   Noemi Chapel, MD    Allergies    Patient  has no known allergies.  Review of Systems   Review of Systems  All other systems reviewed and are negative.   Physical Exam Updated Vital Signs BP 136/76 (BP Location: Left Arm)   Pulse (!) 58   Temp 98 F (36.7 C) (Oral)   Resp 12   Ht 1.88 m (6\' 2" )   Wt 74.8 kg   SpO2 97%   BMI 21.18 kg/m   Physical Exam Vitals and nursing note reviewed.  Constitutional:      General: He is not in acute distress.    Appearance: He is well-developed.  HENT:     Head: Normocephalic and atraumatic.     Mouth/Throat:     Pharynx: No oropharyngeal exudate.  Eyes:     General: No scleral icterus.       Right eye: No discharge.        Left eye: No discharge.     Conjunctiva/sclera: Conjunctivae normal.     Pupils: Pupils are equal, round, and reactive to light.  Neck:     Thyroid: No thyromegaly.     Vascular: No JVD.  Cardiovascular:     Rate and Rhythm: Normal rate and regular rhythm.     Heart sounds: Normal heart sounds. No murmur. No friction rub. No gallop.   Pulmonary:     Effort: Pulmonary effort is normal. No respiratory distress.     Breath sounds: Normal breath sounds. No wheezing or rales.  Abdominal:     General: Bowel sounds are normal. There is no distension.     Palpations: Abdomen is soft. There is no mass.     Tenderness: There is no abdominal tenderness.  Musculoskeletal:        General: No tenderness. Normal range of motion.     Cervical back: Normal range of motion and neck supple.     Comments: There is no tenderness over the cervical thoracic or lumbar spines or the paraspinal muscles  Lymphadenopathy:     Cervical: No cervical adenopathy.  Skin:    General: Skin is warm and dry.     Findings: No erythema or rash.  Neurological:     Mental Status: He is alert.     Coordination: Coordination normal.     Comments: The patient is able to straight leg raise bilaterally with normal strength, hip flexors are totally normal, the patient is able to extend at the  knees and flex at the knees with normal strength, he has normal sensation bilaterally to the diffuse bilateral lower extremities and has normal reflexes at the patellar tendons bilaterally.  Normal strength at the ankles to both plantar and dorsiflexion.    Psychiatric:        Behavior:  Behavior normal.     ED Results / Procedures / Treatments   Labs (all labs ordered are listed, but only abnormal results are displayed) Labs Reviewed - No data to display  EKG None  Radiology DG Lumbar Spine Complete  Result Date: 09/17/2019 CLINICAL DATA:  Low back pain radiating to the bilateral lower extremities for 1 week. No reported injury. EXAM: LUMBAR SPINE - COMPLETE 4+ VIEW COMPARISON:  03/04/2009 CT abdomen/pelvis FINDINGS: This report assumes 5 non rib-bearing lumbar vertebrae. Lumbar vertebral body heights are preserved, with no fracture. Mild multilevel lumbar degenerative disc disease, most prominent at L4-5. Minimal 2 mm retrolisthesis at L3-4. Minimal bilateral lower lumbar facet arthropathy. No aggressive appearing focal osseous lesions. Prominent colonic stool. IMPRESSION: 1. No acute osseous abnormality. 2. Mild multilevel lumbar degenerative disc disease. 3. Minimal 2 mm retrolisthesis at L3-4. 4. Minimal lower lumbar facet arthropathy. 5. Prominent colonic stool, suggesting constipation. Electronically Signed   By: Ilona Sorrel M.D.   On: 09/17/2019 11:26    Procedures Procedures (including critical care time)  Medications Ordered in ED Medications  ketorolac (TORADOL) injection 60 mg (60 mg Intramuscular Given 09/17/19 1108)  dexamethasone (DECADRON) injection 10 mg (10 mg Intramuscular Given 09/17/19 1108)    ED Course  I have reviewed the triage vital signs and the nursing notes.  Pertinent labs & imaging results that were available during my care of the patient were reviewed by me and considered in my medical decision making (see chart for details).    MDM  Rules/Calculators/A&P                      This patient has never had significant back pain before, now presents with some sciatica but seems to be bilateral.  There is absolutely no neurologic deficits and he has no pain with straight leg raise, no deficits with straight leg raise and normal neurologic exam.  We will obtain lumbar spine imaging, start on anti-inflammatories and a shot of Decadron, he will need to follow-up with Dr. [place] Crane his primary physician, he may need an MRI if this continues or worsens, the patient was given strict return instructions as well as the symptomatology that would require and necessitate a follow-up ER visit, he is agreeable.  X-rays without acute findings other than minimal retrolisthesis at L3-L4.  There is no signs of tumors or fractures, the patient has been informed of his results, treated with steroids and anti-inflammatories, stable for discharge, aware of the indications for return  Final Clinical Impression(s) / ED Diagnoses Final diagnoses:  Bilateral sciatica    Rx / DC Orders ED Discharge Orders         Ordered    predniSONE (DELTASONE) 20 MG tablet  Daily     09/17/19 1129    meloxicam (MOBIC) 7.5 MG tablet  2 times daily PRN     09/17/19 1129           Noemi Chapel, MD 09/17/19 1131

## 2019-09-18 DIAGNOSIS — J449 Chronic obstructive pulmonary disease, unspecified: Secondary | ICD-10-CM | POA: Diagnosis not present

## 2019-09-18 DIAGNOSIS — I10 Essential (primary) hypertension: Secondary | ICD-10-CM | POA: Diagnosis not present

## 2019-09-18 DIAGNOSIS — Z720 Tobacco use: Secondary | ICD-10-CM | POA: Diagnosis not present

## 2019-09-25 DIAGNOSIS — I10 Essential (primary) hypertension: Secondary | ICD-10-CM | POA: Diagnosis not present

## 2019-09-25 DIAGNOSIS — M5442 Lumbago with sciatica, left side: Secondary | ICD-10-CM | POA: Diagnosis not present

## 2019-10-18 DIAGNOSIS — I10 Essential (primary) hypertension: Secondary | ICD-10-CM | POA: Diagnosis not present

## 2019-10-18 DIAGNOSIS — Z720 Tobacco use: Secondary | ICD-10-CM | POA: Diagnosis not present

## 2019-10-18 DIAGNOSIS — J449 Chronic obstructive pulmonary disease, unspecified: Secondary | ICD-10-CM | POA: Diagnosis not present

## 2019-11-19 DIAGNOSIS — J449 Chronic obstructive pulmonary disease, unspecified: Secondary | ICD-10-CM | POA: Diagnosis not present

## 2019-11-19 DIAGNOSIS — F17200 Nicotine dependence, unspecified, uncomplicated: Secondary | ICD-10-CM | POA: Diagnosis not present

## 2019-11-19 DIAGNOSIS — I10 Essential (primary) hypertension: Secondary | ICD-10-CM | POA: Diagnosis not present

## 2019-11-19 DIAGNOSIS — Z720 Tobacco use: Secondary | ICD-10-CM | POA: Diagnosis not present

## 2019-12-05 DIAGNOSIS — R7301 Impaired fasting glucose: Secondary | ICD-10-CM | POA: Diagnosis not present

## 2019-12-05 DIAGNOSIS — Z712 Person consulting for explanation of examination or test findings: Secondary | ICD-10-CM | POA: Diagnosis not present

## 2019-12-05 DIAGNOSIS — I10 Essential (primary) hypertension: Secondary | ICD-10-CM | POA: Diagnosis not present

## 2019-12-05 DIAGNOSIS — M19049 Primary osteoarthritis, unspecified hand: Secondary | ICD-10-CM | POA: Diagnosis not present

## 2019-12-05 DIAGNOSIS — R7303 Prediabetes: Secondary | ICD-10-CM | POA: Diagnosis not present

## 2019-12-05 DIAGNOSIS — Z6822 Body mass index (BMI) 22.0-22.9, adult: Secondary | ICD-10-CM | POA: Diagnosis not present

## 2019-12-10 DIAGNOSIS — I10 Essential (primary) hypertension: Secondary | ICD-10-CM | POA: Diagnosis not present

## 2019-12-10 DIAGNOSIS — Z0001 Encounter for general adult medical examination with abnormal findings: Secondary | ICD-10-CM | POA: Diagnosis not present

## 2019-12-10 DIAGNOSIS — F17200 Nicotine dependence, unspecified, uncomplicated: Secondary | ICD-10-CM | POA: Diagnosis not present

## 2019-12-10 DIAGNOSIS — J449 Chronic obstructive pulmonary disease, unspecified: Secondary | ICD-10-CM | POA: Diagnosis not present

## 2019-12-10 DIAGNOSIS — R3912 Poor urinary stream: Secondary | ICD-10-CM | POA: Diagnosis not present

## 2020-01-17 DIAGNOSIS — F17200 Nicotine dependence, unspecified, uncomplicated: Secondary | ICD-10-CM | POA: Diagnosis not present

## 2020-01-17 DIAGNOSIS — I10 Essential (primary) hypertension: Secondary | ICD-10-CM | POA: Diagnosis not present

## 2020-01-17 DIAGNOSIS — Z720 Tobacco use: Secondary | ICD-10-CM | POA: Diagnosis not present

## 2020-01-17 DIAGNOSIS — J449 Chronic obstructive pulmonary disease, unspecified: Secondary | ICD-10-CM | POA: Diagnosis not present

## 2020-01-24 DIAGNOSIS — F17200 Nicotine dependence, unspecified, uncomplicated: Secondary | ICD-10-CM | POA: Diagnosis not present

## 2020-01-24 DIAGNOSIS — I10 Essential (primary) hypertension: Secondary | ICD-10-CM | POA: Diagnosis not present

## 2020-01-24 DIAGNOSIS — Z720 Tobacco use: Secondary | ICD-10-CM | POA: Diagnosis not present

## 2020-01-24 DIAGNOSIS — J449 Chronic obstructive pulmonary disease, unspecified: Secondary | ICD-10-CM | POA: Diagnosis not present

## 2020-02-13 DIAGNOSIS — M5442 Lumbago with sciatica, left side: Secondary | ICD-10-CM | POA: Diagnosis not present

## 2020-02-20 DIAGNOSIS — Z720 Tobacco use: Secondary | ICD-10-CM | POA: Diagnosis not present

## 2020-02-20 DIAGNOSIS — I10 Essential (primary) hypertension: Secondary | ICD-10-CM | POA: Diagnosis not present

## 2020-02-20 DIAGNOSIS — F17200 Nicotine dependence, unspecified, uncomplicated: Secondary | ICD-10-CM | POA: Diagnosis not present

## 2020-02-20 DIAGNOSIS — J449 Chronic obstructive pulmonary disease, unspecified: Secondary | ICD-10-CM | POA: Diagnosis not present

## 2020-03-19 DIAGNOSIS — R7301 Impaired fasting glucose: Secondary | ICD-10-CM | POA: Diagnosis not present

## 2020-03-19 DIAGNOSIS — Z6822 Body mass index (BMI) 22.0-22.9, adult: Secondary | ICD-10-CM | POA: Diagnosis not present

## 2020-03-19 DIAGNOSIS — I10 Essential (primary) hypertension: Secondary | ICD-10-CM | POA: Diagnosis not present

## 2020-03-19 DIAGNOSIS — R7303 Prediabetes: Secondary | ICD-10-CM | POA: Diagnosis not present

## 2020-03-19 DIAGNOSIS — M19049 Primary osteoarthritis, unspecified hand: Secondary | ICD-10-CM | POA: Diagnosis not present

## 2020-03-19 DIAGNOSIS — Z712 Person consulting for explanation of examination or test findings: Secondary | ICD-10-CM | POA: Diagnosis not present

## 2020-03-20 DIAGNOSIS — I10 Essential (primary) hypertension: Secondary | ICD-10-CM | POA: Diagnosis not present

## 2020-03-20 DIAGNOSIS — F17200 Nicotine dependence, unspecified, uncomplicated: Secondary | ICD-10-CM | POA: Diagnosis not present

## 2020-04-18 DIAGNOSIS — I10 Essential (primary) hypertension: Secondary | ICD-10-CM | POA: Diagnosis not present

## 2020-04-25 ENCOUNTER — Other Ambulatory Visit (HOSPITAL_COMMUNITY): Payer: Self-pay | Admitting: Internal Medicine

## 2020-04-25 ENCOUNTER — Other Ambulatory Visit: Payer: Self-pay | Admitting: Internal Medicine

## 2020-04-25 DIAGNOSIS — K469 Unspecified abdominal hernia without obstruction or gangrene: Secondary | ICD-10-CM

## 2020-06-16 DIAGNOSIS — J449 Chronic obstructive pulmonary disease, unspecified: Secondary | ICD-10-CM | POA: Diagnosis not present

## 2020-06-16 DIAGNOSIS — R7303 Prediabetes: Secondary | ICD-10-CM | POA: Diagnosis not present

## 2020-06-16 DIAGNOSIS — I10 Essential (primary) hypertension: Secondary | ICD-10-CM | POA: Diagnosis not present

## 2020-06-16 DIAGNOSIS — R21 Rash and other nonspecific skin eruption: Secondary | ICD-10-CM | POA: Diagnosis not present

## 2020-06-16 DIAGNOSIS — M19049 Primary osteoarthritis, unspecified hand: Secondary | ICD-10-CM | POA: Diagnosis not present

## 2020-06-16 DIAGNOSIS — R7301 Impaired fasting glucose: Secondary | ICD-10-CM | POA: Diagnosis not present

## 2020-06-16 DIAGNOSIS — R3912 Poor urinary stream: Secondary | ICD-10-CM | POA: Diagnosis not present

## 2020-06-16 DIAGNOSIS — Z8551 Personal history of malignant neoplasm of bladder: Secondary | ICD-10-CM | POA: Diagnosis not present

## 2020-06-18 DIAGNOSIS — M19049 Primary osteoarthritis, unspecified hand: Secondary | ICD-10-CM | POA: Diagnosis not present

## 2020-06-18 DIAGNOSIS — R7303 Prediabetes: Secondary | ICD-10-CM | POA: Diagnosis not present

## 2020-06-18 DIAGNOSIS — J449 Chronic obstructive pulmonary disease, unspecified: Secondary | ICD-10-CM | POA: Diagnosis not present

## 2020-06-18 DIAGNOSIS — R21 Rash and other nonspecific skin eruption: Secondary | ICD-10-CM | POA: Diagnosis not present

## 2020-06-18 DIAGNOSIS — R7301 Impaired fasting glucose: Secondary | ICD-10-CM | POA: Diagnosis not present

## 2020-06-18 DIAGNOSIS — F17200 Nicotine dependence, unspecified, uncomplicated: Secondary | ICD-10-CM | POA: Diagnosis not present

## 2020-06-18 DIAGNOSIS — Z8551 Personal history of malignant neoplasm of bladder: Secondary | ICD-10-CM | POA: Diagnosis not present

## 2020-06-18 DIAGNOSIS — I10 Essential (primary) hypertension: Secondary | ICD-10-CM | POA: Diagnosis not present

## 2020-07-16 DIAGNOSIS — E1165 Type 2 diabetes mellitus with hyperglycemia: Secondary | ICD-10-CM | POA: Diagnosis not present

## 2020-07-16 DIAGNOSIS — I10 Essential (primary) hypertension: Secondary | ICD-10-CM | POA: Diagnosis not present

## 2020-08-17 DIAGNOSIS — E1165 Type 2 diabetes mellitus with hyperglycemia: Secondary | ICD-10-CM | POA: Diagnosis not present

## 2020-08-17 DIAGNOSIS — I10 Essential (primary) hypertension: Secondary | ICD-10-CM | POA: Diagnosis not present

## 2020-09-19 DIAGNOSIS — E1165 Type 2 diabetes mellitus with hyperglycemia: Secondary | ICD-10-CM | POA: Diagnosis not present

## 2020-09-19 DIAGNOSIS — I10 Essential (primary) hypertension: Secondary | ICD-10-CM | POA: Diagnosis not present

## 2020-09-22 DIAGNOSIS — J449 Chronic obstructive pulmonary disease, unspecified: Secondary | ICD-10-CM | POA: Diagnosis not present

## 2020-09-22 DIAGNOSIS — I10 Essential (primary) hypertension: Secondary | ICD-10-CM | POA: Diagnosis not present

## 2020-09-22 DIAGNOSIS — F17200 Nicotine dependence, unspecified, uncomplicated: Secondary | ICD-10-CM | POA: Diagnosis not present

## 2020-09-22 DIAGNOSIS — R7301 Impaired fasting glucose: Secondary | ICD-10-CM | POA: Diagnosis not present

## 2020-09-22 DIAGNOSIS — J302 Other seasonal allergic rhinitis: Secondary | ICD-10-CM | POA: Diagnosis not present

## 2020-09-22 DIAGNOSIS — M5442 Lumbago with sciatica, left side: Secondary | ICD-10-CM | POA: Diagnosis not present

## 2020-10-16 DIAGNOSIS — I10 Essential (primary) hypertension: Secondary | ICD-10-CM | POA: Diagnosis not present

## 2020-10-16 DIAGNOSIS — E1165 Type 2 diabetes mellitus with hyperglycemia: Secondary | ICD-10-CM | POA: Diagnosis not present

## 2020-11-16 DIAGNOSIS — E1165 Type 2 diabetes mellitus with hyperglycemia: Secondary | ICD-10-CM | POA: Diagnosis not present

## 2020-11-16 DIAGNOSIS — I10 Essential (primary) hypertension: Secondary | ICD-10-CM | POA: Diagnosis not present

## 2020-12-17 DIAGNOSIS — I10 Essential (primary) hypertension: Secondary | ICD-10-CM | POA: Diagnosis not present

## 2020-12-17 DIAGNOSIS — E1165 Type 2 diabetes mellitus with hyperglycemia: Secondary | ICD-10-CM | POA: Diagnosis not present

## 2021-01-01 DIAGNOSIS — Z23 Encounter for immunization: Secondary | ICD-10-CM | POA: Diagnosis not present

## 2021-01-01 DIAGNOSIS — R7301 Impaired fasting glucose: Secondary | ICD-10-CM | POA: Diagnosis not present

## 2021-01-01 DIAGNOSIS — Z0001 Encounter for general adult medical examination with abnormal findings: Secondary | ICD-10-CM | POA: Diagnosis not present

## 2021-01-01 DIAGNOSIS — I10 Essential (primary) hypertension: Secondary | ICD-10-CM | POA: Diagnosis not present

## 2021-01-01 DIAGNOSIS — M5442 Lumbago with sciatica, left side: Secondary | ICD-10-CM | POA: Diagnosis not present

## 2021-01-01 DIAGNOSIS — F17200 Nicotine dependence, unspecified, uncomplicated: Secondary | ICD-10-CM | POA: Diagnosis not present

## 2021-01-16 DIAGNOSIS — I10 Essential (primary) hypertension: Secondary | ICD-10-CM | POA: Diagnosis not present

## 2021-01-16 DIAGNOSIS — E1165 Type 2 diabetes mellitus with hyperglycemia: Secondary | ICD-10-CM | POA: Diagnosis not present

## 2021-02-16 DIAGNOSIS — I10 Essential (primary) hypertension: Secondary | ICD-10-CM | POA: Diagnosis not present

## 2021-02-16 DIAGNOSIS — E1165 Type 2 diabetes mellitus with hyperglycemia: Secondary | ICD-10-CM | POA: Diagnosis not present

## 2021-03-18 DIAGNOSIS — E1165 Type 2 diabetes mellitus with hyperglycemia: Secondary | ICD-10-CM | POA: Diagnosis not present

## 2021-03-18 DIAGNOSIS — I10 Essential (primary) hypertension: Secondary | ICD-10-CM | POA: Diagnosis not present

## 2021-03-27 DIAGNOSIS — R7301 Impaired fasting glucose: Secondary | ICD-10-CM | POA: Diagnosis not present

## 2021-03-27 DIAGNOSIS — F17200 Nicotine dependence, unspecified, uncomplicated: Secondary | ICD-10-CM | POA: Diagnosis not present

## 2021-04-01 DIAGNOSIS — M5442 Lumbago with sciatica, left side: Secondary | ICD-10-CM | POA: Diagnosis not present

## 2021-04-01 DIAGNOSIS — J302 Other seasonal allergic rhinitis: Secondary | ICD-10-CM | POA: Diagnosis not present

## 2021-04-01 DIAGNOSIS — R7301 Impaired fasting glucose: Secondary | ICD-10-CM | POA: Diagnosis not present

## 2021-04-01 DIAGNOSIS — F17200 Nicotine dependence, unspecified, uncomplicated: Secondary | ICD-10-CM | POA: Diagnosis not present

## 2021-04-01 DIAGNOSIS — J449 Chronic obstructive pulmonary disease, unspecified: Secondary | ICD-10-CM | POA: Diagnosis not present

## 2021-04-01 DIAGNOSIS — Z8551 Personal history of malignant neoplasm of bladder: Secondary | ICD-10-CM | POA: Diagnosis not present

## 2021-04-01 DIAGNOSIS — I10 Essential (primary) hypertension: Secondary | ICD-10-CM | POA: Diagnosis not present

## 2021-04-17 DIAGNOSIS — I10 Essential (primary) hypertension: Secondary | ICD-10-CM | POA: Diagnosis not present

## 2021-04-17 DIAGNOSIS — E1165 Type 2 diabetes mellitus with hyperglycemia: Secondary | ICD-10-CM | POA: Diagnosis not present

## 2021-05-19 DIAGNOSIS — I10 Essential (primary) hypertension: Secondary | ICD-10-CM | POA: Diagnosis not present

## 2021-05-19 DIAGNOSIS — E1165 Type 2 diabetes mellitus with hyperglycemia: Secondary | ICD-10-CM | POA: Diagnosis not present

## 2021-07-10 DIAGNOSIS — M25511 Pain in right shoulder: Secondary | ICD-10-CM | POA: Diagnosis not present

## 2021-07-29 DIAGNOSIS — I10 Essential (primary) hypertension: Secondary | ICD-10-CM | POA: Diagnosis not present

## 2021-07-29 DIAGNOSIS — R7301 Impaired fasting glucose: Secondary | ICD-10-CM | POA: Diagnosis not present

## 2021-08-03 DIAGNOSIS — Z8551 Personal history of malignant neoplasm of bladder: Secondary | ICD-10-CM | POA: Diagnosis not present

## 2021-08-03 DIAGNOSIS — F17200 Nicotine dependence, unspecified, uncomplicated: Secondary | ICD-10-CM | POA: Diagnosis not present

## 2021-08-03 DIAGNOSIS — I10 Essential (primary) hypertension: Secondary | ICD-10-CM | POA: Diagnosis not present

## 2021-08-03 DIAGNOSIS — R7301 Impaired fasting glucose: Secondary | ICD-10-CM | POA: Diagnosis not present

## 2021-08-03 DIAGNOSIS — M5442 Lumbago with sciatica, left side: Secondary | ICD-10-CM | POA: Diagnosis not present

## 2021-08-03 DIAGNOSIS — M25511 Pain in right shoulder: Secondary | ICD-10-CM | POA: Diagnosis not present

## 2021-08-03 DIAGNOSIS — J449 Chronic obstructive pulmonary disease, unspecified: Secondary | ICD-10-CM | POA: Diagnosis not present

## 2021-08-03 DIAGNOSIS — J302 Other seasonal allergic rhinitis: Secondary | ICD-10-CM | POA: Diagnosis not present

## 2021-08-16 DIAGNOSIS — E1165 Type 2 diabetes mellitus with hyperglycemia: Secondary | ICD-10-CM | POA: Diagnosis not present

## 2021-08-16 DIAGNOSIS — I10 Essential (primary) hypertension: Secondary | ICD-10-CM | POA: Diagnosis not present

## 2021-08-16 DIAGNOSIS — J449 Chronic obstructive pulmonary disease, unspecified: Secondary | ICD-10-CM | POA: Diagnosis not present

## 2021-08-16 DIAGNOSIS — M1990 Unspecified osteoarthritis, unspecified site: Secondary | ICD-10-CM | POA: Diagnosis not present

## 2021-09-16 DIAGNOSIS — I10 Essential (primary) hypertension: Secondary | ICD-10-CM | POA: Diagnosis not present

## 2021-09-16 DIAGNOSIS — J449 Chronic obstructive pulmonary disease, unspecified: Secondary | ICD-10-CM | POA: Diagnosis not present

## 2021-09-16 DIAGNOSIS — E1165 Type 2 diabetes mellitus with hyperglycemia: Secondary | ICD-10-CM | POA: Diagnosis not present

## 2021-09-16 DIAGNOSIS — M1990 Unspecified osteoarthritis, unspecified site: Secondary | ICD-10-CM | POA: Diagnosis not present

## 2021-10-16 DIAGNOSIS — M1990 Unspecified osteoarthritis, unspecified site: Secondary | ICD-10-CM | POA: Diagnosis not present

## 2021-10-16 DIAGNOSIS — J449 Chronic obstructive pulmonary disease, unspecified: Secondary | ICD-10-CM | POA: Diagnosis not present

## 2021-10-16 DIAGNOSIS — E1165 Type 2 diabetes mellitus with hyperglycemia: Secondary | ICD-10-CM | POA: Diagnosis not present

## 2021-10-16 DIAGNOSIS — I10 Essential (primary) hypertension: Secondary | ICD-10-CM | POA: Diagnosis not present

## 2021-11-11 DIAGNOSIS — K4090 Unilateral inguinal hernia, without obstruction or gangrene, not specified as recurrent: Secondary | ICD-10-CM | POA: Diagnosis not present

## 2021-11-11 DIAGNOSIS — L989 Disorder of the skin and subcutaneous tissue, unspecified: Secondary | ICD-10-CM | POA: Diagnosis not present

## 2021-11-11 DIAGNOSIS — Z6821 Body mass index (BMI) 21.0-21.9, adult: Secondary | ICD-10-CM | POA: Diagnosis not present

## 2021-11-11 DIAGNOSIS — M5442 Lumbago with sciatica, left side: Secondary | ICD-10-CM | POA: Diagnosis not present

## 2021-11-16 DIAGNOSIS — E1165 Type 2 diabetes mellitus with hyperglycemia: Secondary | ICD-10-CM | POA: Diagnosis not present

## 2021-11-16 DIAGNOSIS — J449 Chronic obstructive pulmonary disease, unspecified: Secondary | ICD-10-CM | POA: Diagnosis not present

## 2021-11-16 DIAGNOSIS — M1990 Unspecified osteoarthritis, unspecified site: Secondary | ICD-10-CM | POA: Diagnosis not present

## 2021-11-16 DIAGNOSIS — I10 Essential (primary) hypertension: Secondary | ICD-10-CM | POA: Diagnosis not present

## 2021-11-22 IMAGING — DX DG LUMBAR SPINE COMPLETE 4+V
5 series · 5 of 5 positions shown · non-contrast
Comparison: 03/04/2009 CT abdomen/pelvis

CLINICAL DATA: Low back pain radiating to the bilateral lower
extremities for 1 week. No reported injury.

EXAM:
LUMBAR SPINE - COMPLETE 4+ VIEW

[l-spine ap]
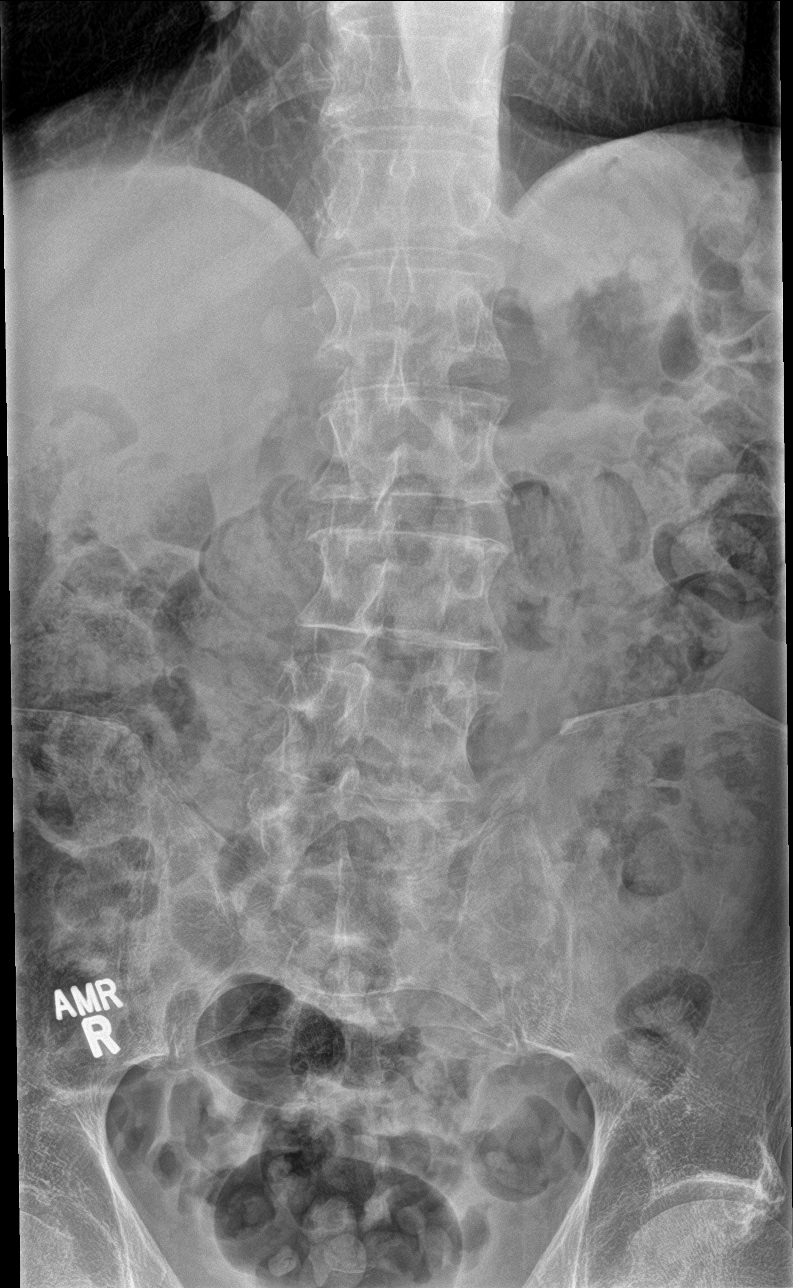

[l-spine obl (1 of 2)]
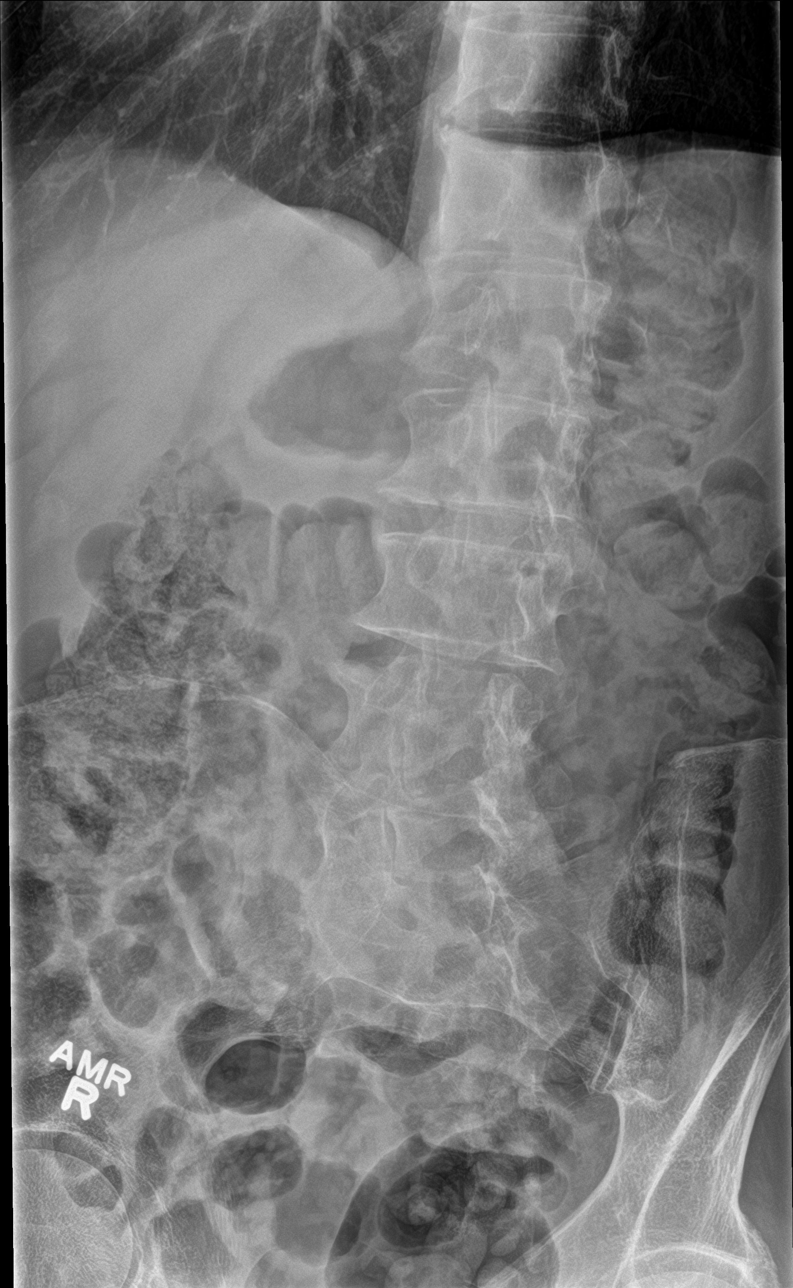

[l-spine obl (2 of 2)]
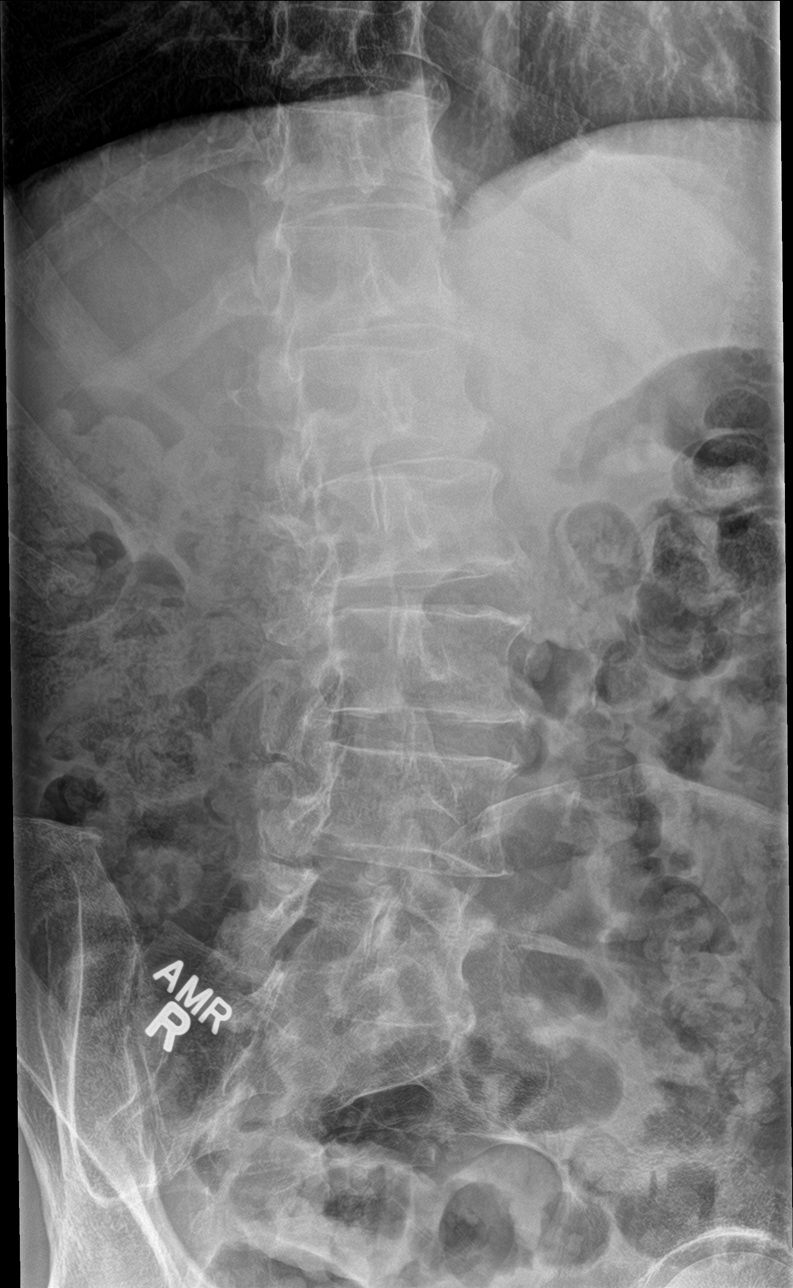

[l-spine lat]
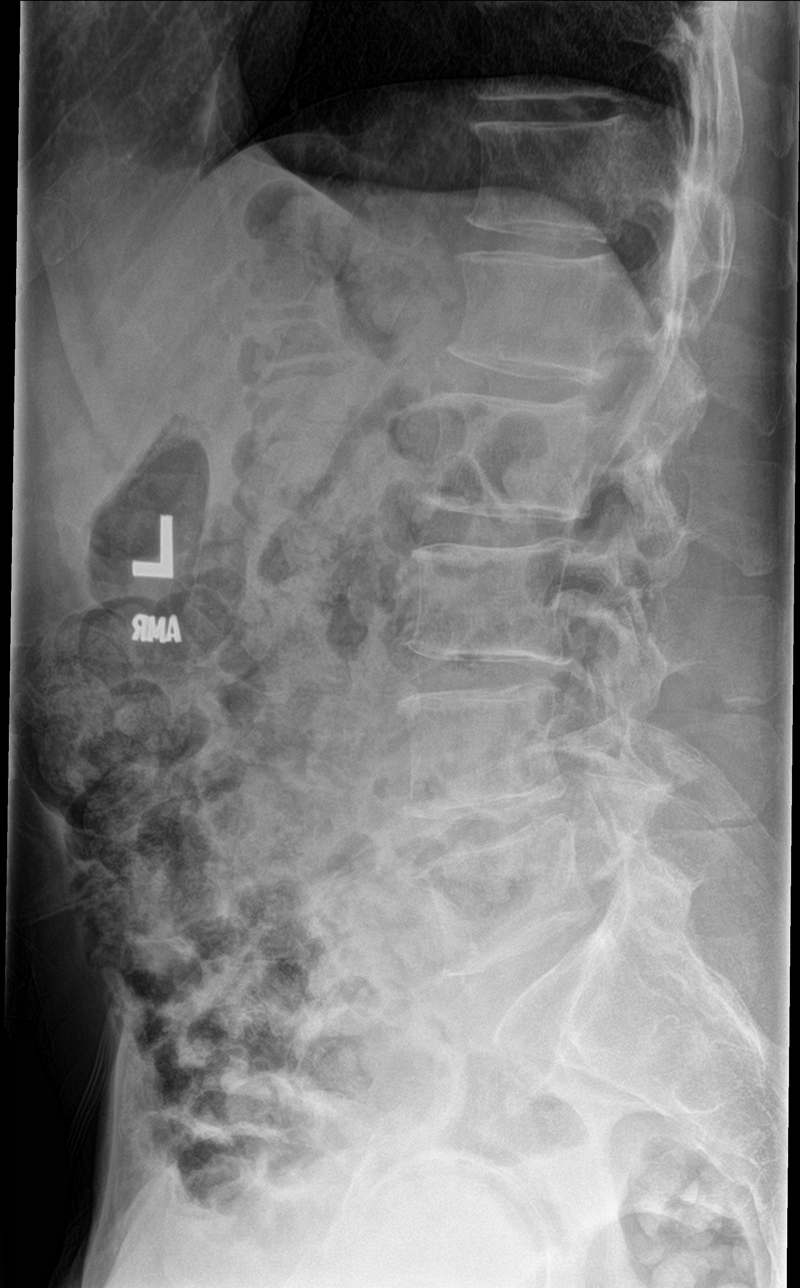

[l-spine spot]
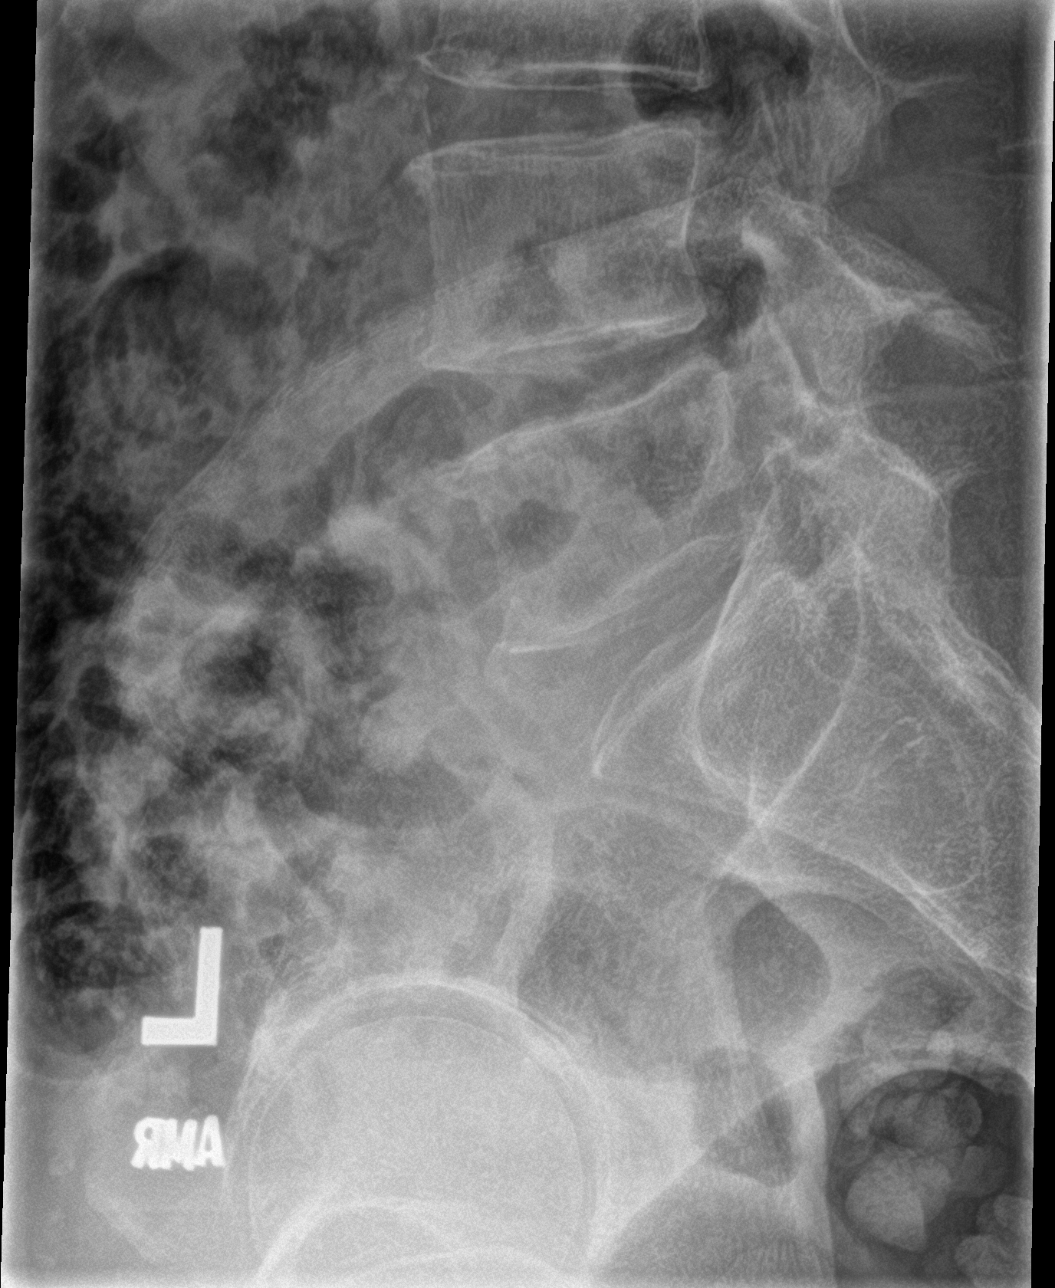

[5 of 5 positions shown; findings below may reference images not displayed]

FINDINGS: This report assumes 5 non rib-bearing lumbar vertebrae.

Lumbar vertebral body heights are preserved, with no fracture.

Mild multilevel lumbar degenerative disc disease, most prominent at
L4-5. Minimal 2 mm retrolisthesis at L3-4. Minimal bilateral lower
lumbar facet arthropathy. No aggressive appearing focal osseous
lesions. Prominent colonic stool.
IMPRESSION: 1. No acute osseous abnormality.
2. Mild multilevel lumbar degenerative disc disease.
3. Minimal 2 mm retrolisthesis at L3-4.
4. Minimal lower lumbar facet arthropathy.
5. Prominent colonic stool, suggesting constipation.

## 2021-12-22 DIAGNOSIS — Z23 Encounter for immunization: Secondary | ICD-10-CM | POA: Diagnosis not present

## 2022-01-26 DIAGNOSIS — I10 Essential (primary) hypertension: Secondary | ICD-10-CM | POA: Diagnosis not present

## 2022-01-26 DIAGNOSIS — R7301 Impaired fasting glucose: Secondary | ICD-10-CM | POA: Diagnosis not present

## 2022-02-02 DIAGNOSIS — R7301 Impaired fasting glucose: Secondary | ICD-10-CM | POA: Diagnosis not present

## 2022-02-02 DIAGNOSIS — Z0000 Encounter for general adult medical examination without abnormal findings: Secondary | ICD-10-CM | POA: Diagnosis not present

## 2022-02-02 DIAGNOSIS — M5442 Lumbago with sciatica, left side: Secondary | ICD-10-CM | POA: Diagnosis not present

## 2022-02-02 DIAGNOSIS — J302 Other seasonal allergic rhinitis: Secondary | ICD-10-CM | POA: Diagnosis not present

## 2022-02-02 DIAGNOSIS — E785 Hyperlipidemia, unspecified: Secondary | ICD-10-CM | POA: Diagnosis not present

## 2022-02-02 DIAGNOSIS — I10 Essential (primary) hypertension: Secondary | ICD-10-CM | POA: Diagnosis not present

## 2022-02-02 DIAGNOSIS — F17200 Nicotine dependence, unspecified, uncomplicated: Secondary | ICD-10-CM | POA: Diagnosis not present

## 2022-02-02 DIAGNOSIS — J449 Chronic obstructive pulmonary disease, unspecified: Secondary | ICD-10-CM | POA: Diagnosis not present

## 2022-03-01 DIAGNOSIS — M79675 Pain in left toe(s): Secondary | ICD-10-CM | POA: Diagnosis not present

## 2022-03-01 DIAGNOSIS — M79671 Pain in right foot: Secondary | ICD-10-CM | POA: Diagnosis not present

## 2022-03-01 DIAGNOSIS — M79674 Pain in right toe(s): Secondary | ICD-10-CM | POA: Diagnosis not present

## 2022-03-01 DIAGNOSIS — B351 Tinea unguium: Secondary | ICD-10-CM | POA: Diagnosis not present

## 2022-03-01 DIAGNOSIS — Q828 Other specified congenital malformations of skin: Secondary | ICD-10-CM | POA: Diagnosis not present

## 2022-03-01 DIAGNOSIS — L110 Acquired keratosis follicularis: Secondary | ICD-10-CM | POA: Diagnosis not present

## 2022-03-01 DIAGNOSIS — M79672 Pain in left foot: Secondary | ICD-10-CM | POA: Diagnosis not present

## 2022-03-17 DIAGNOSIS — Q828 Other specified congenital malformations of skin: Secondary | ICD-10-CM | POA: Diagnosis not present

## 2022-03-17 DIAGNOSIS — L110 Acquired keratosis follicularis: Secondary | ICD-10-CM | POA: Diagnosis not present

## 2022-03-17 DIAGNOSIS — M79671 Pain in right foot: Secondary | ICD-10-CM | POA: Diagnosis not present

## 2022-03-17 DIAGNOSIS — M79674 Pain in right toe(s): Secondary | ICD-10-CM | POA: Diagnosis not present

## 2022-03-17 DIAGNOSIS — M79672 Pain in left foot: Secondary | ICD-10-CM | POA: Diagnosis not present

## 2022-03-17 DIAGNOSIS — B351 Tinea unguium: Secondary | ICD-10-CM | POA: Diagnosis not present

## 2022-03-17 DIAGNOSIS — M79675 Pain in left toe(s): Secondary | ICD-10-CM | POA: Diagnosis not present

## 2022-04-16 DIAGNOSIS — Z23 Encounter for immunization: Secondary | ICD-10-CM | POA: Diagnosis not present

## 2022-05-05 DIAGNOSIS — K5900 Constipation, unspecified: Secondary | ICD-10-CM | POA: Diagnosis not present

## 2022-05-05 DIAGNOSIS — M5440 Lumbago with sciatica, unspecified side: Secondary | ICD-10-CM | POA: Diagnosis not present

## 2022-05-05 DIAGNOSIS — F17210 Nicotine dependence, cigarettes, uncomplicated: Secondary | ICD-10-CM | POA: Diagnosis not present

## 2022-05-05 DIAGNOSIS — Z79899 Other long term (current) drug therapy: Secondary | ICD-10-CM | POA: Diagnosis not present

## 2022-05-05 DIAGNOSIS — J449 Chronic obstructive pulmonary disease, unspecified: Secondary | ICD-10-CM | POA: Diagnosis not present

## 2022-05-05 DIAGNOSIS — I10 Essential (primary) hypertension: Secondary | ICD-10-CM | POA: Diagnosis not present

## 2022-06-03 DIAGNOSIS — Z1211 Encounter for screening for malignant neoplasm of colon: Secondary | ICD-10-CM | POA: Diagnosis not present

## 2022-06-09 DIAGNOSIS — M5440 Lumbago with sciatica, unspecified side: Secondary | ICD-10-CM | POA: Diagnosis not present

## 2022-06-09 DIAGNOSIS — F17210 Nicotine dependence, cigarettes, uncomplicated: Secondary | ICD-10-CM | POA: Diagnosis not present

## 2022-06-09 DIAGNOSIS — J449 Chronic obstructive pulmonary disease, unspecified: Secondary | ICD-10-CM | POA: Diagnosis not present

## 2022-06-09 DIAGNOSIS — I10 Essential (primary) hypertension: Secondary | ICD-10-CM | POA: Diagnosis not present

## 2022-07-29 DIAGNOSIS — R7301 Impaired fasting glucose: Secondary | ICD-10-CM | POA: Diagnosis not present

## 2022-07-29 DIAGNOSIS — I10 Essential (primary) hypertension: Secondary | ICD-10-CM | POA: Diagnosis not present

## 2022-08-03 DIAGNOSIS — Z0000 Encounter for general adult medical examination without abnormal findings: Secondary | ICD-10-CM | POA: Diagnosis not present

## 2022-08-04 DIAGNOSIS — F17210 Nicotine dependence, cigarettes, uncomplicated: Secondary | ICD-10-CM | POA: Diagnosis not present

## 2022-08-04 DIAGNOSIS — I10 Essential (primary) hypertension: Secondary | ICD-10-CM | POA: Diagnosis not present

## 2022-08-04 DIAGNOSIS — J449 Chronic obstructive pulmonary disease, unspecified: Secondary | ICD-10-CM | POA: Diagnosis not present

## 2022-08-04 DIAGNOSIS — M5440 Lumbago with sciatica, unspecified side: Secondary | ICD-10-CM | POA: Diagnosis not present

## 2022-08-04 DIAGNOSIS — Z8551 Personal history of malignant neoplasm of bladder: Secondary | ICD-10-CM | POA: Diagnosis not present

## 2022-08-04 DIAGNOSIS — J302 Other seasonal allergic rhinitis: Secondary | ICD-10-CM | POA: Diagnosis not present

## 2022-08-04 DIAGNOSIS — E785 Hyperlipidemia, unspecified: Secondary | ICD-10-CM | POA: Diagnosis not present

## 2022-08-04 DIAGNOSIS — R7303 Prediabetes: Secondary | ICD-10-CM | POA: Diagnosis not present

## 2022-08-26 ENCOUNTER — Other Ambulatory Visit (HOSPITAL_COMMUNITY): Payer: Self-pay | Admitting: Internal Medicine

## 2022-08-26 DIAGNOSIS — M25569 Pain in unspecified knee: Secondary | ICD-10-CM

## 2022-08-26 DIAGNOSIS — M79606 Pain in leg, unspecified: Secondary | ICD-10-CM | POA: Diagnosis not present

## 2022-08-26 DIAGNOSIS — R5383 Other fatigue: Secondary | ICD-10-CM | POA: Diagnosis not present

## 2022-09-01 ENCOUNTER — Ambulatory Visit (HOSPITAL_COMMUNITY)
Admission: RE | Admit: 2022-09-01 | Discharge: 2022-09-01 | Disposition: A | Discharge status: Home / Self Care | Payer: 59 | Source: Ambulatory Visit | Attending: Internal Medicine | Admitting: Internal Medicine

## 2022-09-01 DIAGNOSIS — M79604 Pain in right leg: Secondary | ICD-10-CM | POA: Diagnosis not present

## 2022-09-01 DIAGNOSIS — M79605 Pain in left leg: Secondary | ICD-10-CM | POA: Diagnosis not present

## 2022-09-01 DIAGNOSIS — M25569 Pain in unspecified knee: Secondary | ICD-10-CM | POA: Diagnosis not present

## 2022-10-22 ENCOUNTER — Ambulatory Visit: Payer: 59 | Admitting: Internal Medicine

## 2022-11-03 DIAGNOSIS — F17210 Nicotine dependence, cigarettes, uncomplicated: Secondary | ICD-10-CM | POA: Diagnosis not present

## 2022-11-03 DIAGNOSIS — Z79899 Other long term (current) drug therapy: Secondary | ICD-10-CM | POA: Diagnosis not present

## 2022-11-03 DIAGNOSIS — I10 Essential (primary) hypertension: Secondary | ICD-10-CM | POA: Diagnosis not present

## 2022-11-03 DIAGNOSIS — Z6822 Body mass index (BMI) 22.0-22.9, adult: Secondary | ICD-10-CM | POA: Diagnosis not present

## 2022-11-03 DIAGNOSIS — Z713 Dietary counseling and surveillance: Secondary | ICD-10-CM | POA: Diagnosis not present

## 2022-12-21 DIAGNOSIS — Z23 Encounter for immunization: Secondary | ICD-10-CM | POA: Diagnosis not present

## 2022-12-30 DIAGNOSIS — M79674 Pain in right toe(s): Secondary | ICD-10-CM | POA: Diagnosis not present

## 2022-12-30 DIAGNOSIS — L609 Nail disorder, unspecified: Secondary | ICD-10-CM | POA: Diagnosis not present

## 2022-12-30 DIAGNOSIS — M79675 Pain in left toe(s): Secondary | ICD-10-CM | POA: Diagnosis not present

## 2022-12-30 DIAGNOSIS — L110 Acquired keratosis follicularis: Secondary | ICD-10-CM | POA: Diagnosis not present

## 2022-12-30 DIAGNOSIS — M79672 Pain in left foot: Secondary | ICD-10-CM | POA: Diagnosis not present

## 2022-12-30 DIAGNOSIS — I739 Peripheral vascular disease, unspecified: Secondary | ICD-10-CM | POA: Diagnosis not present

## 2022-12-30 DIAGNOSIS — M79671 Pain in right foot: Secondary | ICD-10-CM | POA: Diagnosis not present

## 2023-01-28 DIAGNOSIS — E785 Hyperlipidemia, unspecified: Secondary | ICD-10-CM | POA: Diagnosis not present

## 2023-01-28 DIAGNOSIS — R7303 Prediabetes: Secondary | ICD-10-CM | POA: Diagnosis not present

## 2023-02-03 DIAGNOSIS — R7303 Prediabetes: Secondary | ICD-10-CM | POA: Diagnosis not present

## 2023-02-03 DIAGNOSIS — J449 Chronic obstructive pulmonary disease, unspecified: Secondary | ICD-10-CM | POA: Diagnosis not present

## 2023-02-03 DIAGNOSIS — F17210 Nicotine dependence, cigarettes, uncomplicated: Secondary | ICD-10-CM | POA: Diagnosis not present

## 2023-02-03 DIAGNOSIS — M5440 Lumbago with sciatica, unspecified side: Secondary | ICD-10-CM | POA: Diagnosis not present

## 2023-02-03 DIAGNOSIS — J302 Other seasonal allergic rhinitis: Secondary | ICD-10-CM | POA: Diagnosis not present

## 2023-02-03 DIAGNOSIS — E785 Hyperlipidemia, unspecified: Secondary | ICD-10-CM | POA: Diagnosis not present

## 2023-02-03 DIAGNOSIS — I10 Essential (primary) hypertension: Secondary | ICD-10-CM | POA: Diagnosis not present

## 2023-02-21 DIAGNOSIS — D485 Neoplasm of uncertain behavior of skin: Secondary | ICD-10-CM | POA: Diagnosis not present

## 2023-02-21 DIAGNOSIS — L578 Other skin changes due to chronic exposure to nonionizing radiation: Secondary | ICD-10-CM | POA: Diagnosis not present

## 2023-02-21 DIAGNOSIS — L905 Scar conditions and fibrosis of skin: Secondary | ICD-10-CM | POA: Diagnosis not present

## 2023-02-21 DIAGNOSIS — L821 Other seborrheic keratosis: Secondary | ICD-10-CM | POA: Diagnosis not present

## 2023-03-31 DIAGNOSIS — M79672 Pain in left foot: Secondary | ICD-10-CM | POA: Diagnosis not present

## 2023-03-31 DIAGNOSIS — I739 Peripheral vascular disease, unspecified: Secondary | ICD-10-CM | POA: Diagnosis not present

## 2023-03-31 DIAGNOSIS — M79675 Pain in left toe(s): Secondary | ICD-10-CM | POA: Diagnosis not present

## 2023-03-31 DIAGNOSIS — L110 Acquired keratosis follicularis: Secondary | ICD-10-CM | POA: Diagnosis not present

## 2023-03-31 DIAGNOSIS — M79674 Pain in right toe(s): Secondary | ICD-10-CM | POA: Diagnosis not present

## 2023-03-31 DIAGNOSIS — M79671 Pain in right foot: Secondary | ICD-10-CM | POA: Diagnosis not present

## 2023-03-31 DIAGNOSIS — L609 Nail disorder, unspecified: Secondary | ICD-10-CM | POA: Diagnosis not present

## 2023-05-05 DIAGNOSIS — F17201 Nicotine dependence, unspecified, in remission: Secondary | ICD-10-CM | POA: Diagnosis not present

## 2023-05-05 DIAGNOSIS — Z79899 Other long term (current) drug therapy: Secondary | ICD-10-CM | POA: Diagnosis not present

## 2023-05-05 DIAGNOSIS — Z7951 Long term (current) use of inhaled steroids: Secondary | ICD-10-CM | POA: Diagnosis not present

## 2023-05-05 DIAGNOSIS — L209 Atopic dermatitis, unspecified: Secondary | ICD-10-CM | POA: Diagnosis not present

## 2023-05-05 DIAGNOSIS — L709 Acne, unspecified: Secondary | ICD-10-CM | POA: Diagnosis not present

## 2023-05-05 DIAGNOSIS — J302 Other seasonal allergic rhinitis: Secondary | ICD-10-CM | POA: Diagnosis not present

## 2023-05-05 DIAGNOSIS — J449 Chronic obstructive pulmonary disease, unspecified: Secondary | ICD-10-CM | POA: Diagnosis not present

## 2023-05-05 DIAGNOSIS — I10 Essential (primary) hypertension: Secondary | ICD-10-CM | POA: Diagnosis not present

## 2023-05-25 DIAGNOSIS — B078 Other viral warts: Secondary | ICD-10-CM | POA: Diagnosis not present

## 2023-05-25 DIAGNOSIS — L905 Scar conditions and fibrosis of skin: Secondary | ICD-10-CM | POA: Diagnosis not present

## 2023-06-21 DIAGNOSIS — L218 Other seborrheic dermatitis: Secondary | ICD-10-CM | POA: Diagnosis not present

## 2023-06-21 DIAGNOSIS — L708 Other acne: Secondary | ICD-10-CM | POA: Diagnosis not present

## 2023-06-21 DIAGNOSIS — L821 Other seborrheic keratosis: Secondary | ICD-10-CM | POA: Diagnosis not present

## 2023-06-23 DIAGNOSIS — I739 Peripheral vascular disease, unspecified: Secondary | ICD-10-CM | POA: Diagnosis not present

## 2023-06-23 DIAGNOSIS — L609 Nail disorder, unspecified: Secondary | ICD-10-CM | POA: Diagnosis not present

## 2023-06-23 DIAGNOSIS — M79675 Pain in left toe(s): Secondary | ICD-10-CM | POA: Diagnosis not present

## 2023-06-23 DIAGNOSIS — M79671 Pain in right foot: Secondary | ICD-10-CM | POA: Diagnosis not present

## 2023-06-23 DIAGNOSIS — L565 Disseminated superficial actinic porokeratosis (DSAP): Secondary | ICD-10-CM | POA: Diagnosis not present

## 2023-06-23 DIAGNOSIS — M79674 Pain in right toe(s): Secondary | ICD-10-CM | POA: Diagnosis not present

## 2023-06-23 DIAGNOSIS — L110 Acquired keratosis follicularis: Secondary | ICD-10-CM | POA: Diagnosis not present

## 2023-06-23 DIAGNOSIS — M79672 Pain in left foot: Secondary | ICD-10-CM | POA: Diagnosis not present

## 2023-07-29 DIAGNOSIS — R404 Transient alteration of awareness: Secondary | ICD-10-CM | POA: Diagnosis not present

## 2023-07-29 DIAGNOSIS — Z743 Need for continuous supervision: Secondary | ICD-10-CM | POA: Diagnosis not present

## 2023-08-18 DIAGNOSIS — 419620001 Death: Secondary | SNOMED CT | POA: Diagnosis not present

## 2023-08-18 DEATH — deceased
